# Patient Record
Sex: Male | Born: 1985
Health system: Southern US, Community
[De-identification: ages and names within clinical notes are randomized; demographics above are authoritative.]

## PROBLEM LIST (undated history)

## (undated) DIAGNOSIS — D593 Hemolytic-uremic syndrome, unspecified: Secondary | ICD-10-CM

## (undated) DIAGNOSIS — I82629 Acute embolism and thrombosis of deep veins of unspecified upper extremity: Secondary | ICD-10-CM

## (undated) HISTORY — DX: Acute embolism and thrombosis of deep veins of unspecified upper extremity: I82.629

## (undated) HISTORY — DX: Hemolytic-uremic syndrome, unspecified: D59.30

## (undated) HISTORY — DX: Hemolytic-uremic syndrome: D59.3

---
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
## 2018-09-20 LAB — HM COLONOSCOPY

## 2019-07-16 ENCOUNTER — Ambulatory Visit: Payer: Self-pay | Admitting: Family Medicine

## 2019-07-25 ENCOUNTER — Other Ambulatory Visit: Payer: Self-pay

## 2019-07-25 ENCOUNTER — Encounter: Payer: Self-pay | Admitting: Family Medicine

## 2019-07-25 ENCOUNTER — Ambulatory Visit (INDEPENDENT_AMBULATORY_CARE_PROVIDER_SITE_OTHER): Payer: No Typology Code available for payment source | Admitting: Family Medicine

## 2019-07-25 VITALS — BP 108/74 | HR 71 | Temp 98.7°F | Ht 69.0 in | Wt 169.4 lb

## 2019-07-25 DIAGNOSIS — Z0001 Encounter for general adult medical examination with abnormal findings: Secondary | ICD-10-CM

## 2019-07-25 DIAGNOSIS — Z23 Encounter for immunization: Secondary | ICD-10-CM

## 2019-07-25 DIAGNOSIS — Z6825 Body mass index (BMI) 25.0-25.9, adult: Secondary | ICD-10-CM

## 2019-07-25 DIAGNOSIS — J309 Allergic rhinitis, unspecified: Secondary | ICD-10-CM | POA: Diagnosis not present

## 2019-07-25 DIAGNOSIS — E663 Overweight: Secondary | ICD-10-CM

## 2019-07-25 MED ORDER — FLUTICASONE PROPIONATE 50 MCG/ACT NA SUSP: 2.0000 | Freq: Every day | NASAL | 6 refills | Status: DC

## 2019-07-25 NOTE — Progress Notes (Addendum)
Chief Complaint:  Charles Gordon is a 33 y.o. male who presents today for his annual comprehensive physical exam and to establish care.   Assessment/Plan:  Allergic Rhinitis Stable. Sent in Rx for flonase.   Body mass index is 25.01 kg/m. / Overweight BMI Metric Follow Up - 07/25/19 1430      BMI Metric Follow Up-Please document annually   BMI Metric Follow Up  Education provided        Preventative Healthcare: Flu vaccine today. UTD on other screenings and immunizations.   Patient Counseling(The following topics were reviewed and/or handout was given):  -Nutrition: Stressed importance of moderation in sodium/caffeine intake, saturated fat and cholesterol, caloric balance, sufficient intake of fresh fruits, vegetables, and fiber.  -Stressed the importance of regular exercise.   -Substance Abuse: Discussed cessation/primary prevention of tobacco, alcohol, or other drug use; driving or other dangerous activities under the influence; availability of treatment for abuse.   -Injury prevention: Discussed safety belts, safety helmets, smoke detector, smoking near bedding or upholstery.   -Sexuality: Discussed sexually transmitted diseases, partner selection, use of condoms, avoidance of unintended pregnancy and contraceptive alternatives.   -Dental health: Discussed importance of regular tooth brushing, flossing, and dental visits.  -Health maintenance and immunizations reviewed. Please refer to Health maintenance section.  Return to care in 1 year for next preventative visit.     Subjective:  HPI:  He has no acute complaints today.   He recently moved to [facility] and has had some nasal allergies.  He has used Flonase in the past and is done well with this.  Would like a refill today.  Lifestyle Diet: No specific diets or eating plans. Tries to eat a lot of fruits and vegetables.  Exercise: No specific exercises.   Depression screen PHQ 2/9 07/25/2019  Decreased Interest 0  Down,  Depressed, Hopeless 0  PHQ - 2 Score 0    Health Maintenance Due  Topic Date Due  . HIV Screening  02/28/2000  . TETANUS/TDAP  02/28/2004     ROS: Per HPI, otherwise a complete review of systems was negative.   PMH:  The following were reviewed and entered/updated in epic: Past Medical History:  Diagnosis Date  . DVT of upper extremity (deep vein thrombosis) ([place])   . 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  Past Surgical History:  Procedure Laterality Date  . PILONIDAL CYST EXCISION  2003    Family History  Problem Relation Age of Onset  . Colon cancer Maternal Grandmother        onset in late 85s  . Prostate cancer Neg Hx     Medications- reviewed and updated Current Outpatient Medications  Medication Sig Dispense Refill  . fluticasone (FLONASE) 50 MCG/ACT nasal spray Place 2 sprays into both nostrils daily. 16 g 6   No current facility-administered medications for this visit.     Allergies-reviewed and updated Allergies  Allergen Reactions  . Sulfa Antibiotics Swelling  . Penicillins Rash    Social History   Socioeconomic History  . Marital status: Married    Spouse name: Not on file  . Number of children: Not on file  . Years of education: Not on file  . Highest education level: Not on file  Occupational History  . Not on file  Social Needs  . Financial resource strain: Not on file  . Food insecurity    Worry:  Not on file    Inability: Not on file  . Transportation needs    Medical: Not on file    Non-medical: Not on file  Tobacco Use  . Smoking status: Never Smoker  . Smokeless tobacco: Never Used  Substance and Sexual Activity  . Alcohol use: Never    Frequency: Never  . Drug use: Never  . Sexual activity: Not on file  Lifestyle  . Physical activity    Days per week: Not on file    Minutes per session: Not on file  . Stress: Not on file  Relationships  .  Social Herbalist on phone: Not on file    Gets together: Not on file    Attends religious service: Not on file    Active member of club or organization: Not on file    Attends meetings of clubs or organizations: Not on file    Relationship status: Not on file  Other Topics Concern  . Not on file  Social History Narrative  . Not on file        Objective:  Physical Exam: BP 108/74   Pulse 71   Temp 98.7 F (37.1 C)   Ht 5\' 9"  (1.753 m)   Wt 169 lb 6.1 oz (76.8 kg)   SpO2 99%   BMI 25.01 kg/m   Body mass index is 25.01 kg/m. Wt Readings from Last 3 Encounters:  07/25/19 169 lb 6.1 oz (76.8 kg)   Gen: NAD, resting comfortably HEENT: TMs normal bilaterally. OP clear. No thyromegaly noted.  CV: RRR with no murmurs appreciated Pulm: NWOB, CTAB with no crackles, wheezes, or rhonchi GI: Normal bowel sounds present. Soft, Nontender, Nondistended. MSK: no edema, cyanosis, or clubbing noted Skin: warm, dry Neuro: CN2-12 grossly intact. Strength 5/5 in upper and lower extremities. Reflexes symmetric and intact bilaterally.  Psych: Normal affect and thought content     Saydee Zolman M. Jerline Pain, MD 07/25/2019 2:30 PM

## 2019-07-25 NOTE — Patient Instructions (Addendum)
It was very nice to see you today!  Keep up the good work!  I will send in your Flonase.  Please come back to see me in 1 year for your physical with blood work, or sooner if needed.   Take care, Dr Jerline Pain  Please try these tips to maintain a healthy lifestyle:   Eat at least 3 REAL meals and 1-2 snacks per day.  Aim for no more than 5 hours between eating.  If you eat breakfast, please do so within one hour of getting up.    Obtain twice as many fruits/vegetables as protein or carbohydrate foods for both lunch and dinner. (Half of each meal should be fruits/vegetables, one quarter protein, and one quarter starchy carbs)   Cut down on sweet beverages. This includes juice, soda, and sweet tea.    Exercise at least 150 minutes every week.    Preventive Care 9-1 Years Old, Male Preventive care refers to lifestyle choices and visits with your health care provider that can promote health and wellness. This includes:  A yearly physical exam. This is also called an annual well check.  Regular dental and eye exams.  Immunizations.  Screening for certain conditions.  Healthy lifestyle choices, such as eating a healthy diet, getting regular exercise, not using drugs or products that contain nicotine and tobacco, and limiting alcohol use. What can I expect for my preventive care visit? Physical exam Your health care provider will check:  Height and weight. These may be used to calculate body mass index (BMI), which is a measurement that tells if you are at a healthy weight.  Heart rate and blood pressure.  Your skin for abnormal spots. Counseling Your health care provider may ask you questions about:  Alcohol, tobacco, and drug use.  Emotional well-being.  Home and relationship well-being.  Sexual activity.  Eating habits.  Work and work Statistician. What immunizations do I need?  Influenza (flu) vaccine  This is recommended every year. Tetanus, diphtheria,  and pertussis (Tdap) vaccine  You may need a Td booster every 10 years. Varicella (chickenpox) vaccine  You may need this vaccine if you have not already been vaccinated. Human papillomavirus (HPV) vaccine  If recommended by your health care provider, you may need three doses over 6 months. Measles, mumps, and rubella (MMR) vaccine  You may need at least one dose of MMR. You may also need a second dose. Meningococcal conjugate (MenACWY) vaccine  One dose is recommended if you are 29-85 years old and a Market researcher living in a residence hall, or if you have one of several medical conditions. You may also need additional booster doses. Pneumococcal conjugate (PCV13) vaccine  You may need this if you have certain conditions and were not previously vaccinated. Pneumococcal polysaccharide (PPSV23) vaccine  You may need one or two doses if you smoke cigarettes or if you have certain conditions. Hepatitis A vaccine  You may need this if you have certain conditions or if you travel or work in places where you may be exposed to hepatitis A. Hepatitis B vaccine  You may need this if you have certain conditions or if you travel or work in places where you may be exposed to hepatitis B. Haemophilus influenzae type b (Hib) vaccine  You may need this if you have certain risk factors. You may receive vaccines as individual doses or as more than one vaccine together in one shot (combination vaccines). Talk with your health care provider about the  risks and benefits of combination vaccines. What tests do I need? Blood tests  Lipid and cholesterol levels. These may be checked every 5 years starting at age 36.  Hepatitis C test.  Hepatitis B test. Screening   Diabetes screening. This is done by checking your blood sugar (glucose) after you have not eaten for a while (fasting).  Sexually transmitted disease (STD) testing. Talk with your health care provider about your test  results, treatment options, and if necessary, the need for more tests. Follow these instructions at home: Eating and drinking   Eat a diet that includes fresh fruits and vegetables, whole grains, lean protein, and low-fat dairy products.  Take vitamin and mineral supplements as recommended by your health care provider.  Do not drink alcohol if your health care provider tells you not to drink.  If you drink alcohol: ? Limit how much you have to 0-2 drinks a day. ? Be aware of how much alcohol is in your drink. In the [place] one drink equals one 12 oz bottle of beer (355 mL), one 5 oz glass of wine (148 mL), or one 1 oz glass of hard liquor (44 mL). Lifestyle  Take daily care of your teeth and gums.  Stay active. Exercise for at least 30 minutes on 5 or more days each week.  Do not use any products that contain nicotine or tobacco, such as cigarettes, e-cigarettes, and chewing tobacco. If you need help quitting, ask your health care provider.  If you are sexually active, practice safe sex. Use a condom or other form of protection to prevent STIs (sexually transmitted infections). What's next?  Go to your health care provider once a year for a well check visit.  Ask your health care provider how often you should have your eyes and teeth checked.  Stay up to date on all vaccines. This information is not intended to replace advice given to you by your health care provider. Make sure you discuss any questions you have with your health care provider. Document Released: 11/29/2001 Document Revised: 09/27/2018 Document Reviewed: 09/27/2018 Elsevier Patient Education  2020 [employer].

## 2019-07-25 NOTE — Addendum Note (Signed)
Addended by: Loralyn Freshwater on: 07/25/2019 02:40 PM   Modules accepted: Orders

## 2019-08-27 ENCOUNTER — Encounter: Payer: Self-pay | Admitting: Family Medicine

## 2019-12-29 ENCOUNTER — Encounter: Payer: Self-pay | Admitting: Family Medicine

## 2019-12-30 ENCOUNTER — Telehealth: Payer: Self-pay | Admitting: Family Medicine

## 2019-12-30 ENCOUNTER — Other Ambulatory Visit: Payer: Self-pay

## 2019-12-30 DIAGNOSIS — R1031 Right lower quadrant pain: Secondary | ICD-10-CM

## 2019-12-30 NOTE — Telephone Encounter (Signed)
Patient has an appointment on 3/25.  His condition has worsened.  Patient works at [employer].  He is requesting to get worked in this afternoon with Dr. Jerline Pain due to his work schedule the rest of the week.

## 2019-12-30 NOTE — Telephone Encounter (Signed)
Noted  

## 2019-12-31 ENCOUNTER — Other Ambulatory Visit: Payer: Self-pay | Admitting: Family Medicine

## 2019-12-31 DIAGNOSIS — R1031 Right lower quadrant pain: Secondary | ICD-10-CM

## 2020-01-02 ENCOUNTER — Ambulatory Visit: Payer: No Typology Code available for payment source | Admitting: Family Medicine

## 2020-01-03 ENCOUNTER — Ambulatory Visit (INDEPENDENT_AMBULATORY_CARE_PROVIDER_SITE_OTHER): Payer: No Typology Code available for payment source | Admitting: Family Medicine

## 2020-01-03 ENCOUNTER — Other Ambulatory Visit: Payer: Self-pay

## 2020-01-03 ENCOUNTER — Encounter: Payer: Self-pay | Admitting: Family Medicine

## 2020-01-03 VITALS — BP 110/70 | HR 72 | Temp 98.7°F | Ht 69.0 in | Wt 169.7 lb

## 2020-01-03 DIAGNOSIS — R1031 Right lower quadrant pain: Secondary | ICD-10-CM | POA: Diagnosis not present

## 2020-01-03 LAB — BASIC METABOLIC PANEL
BUN: 13 mg/dL (ref 6–23)
CO2: 30 mEq/L (ref 19–32)
Calcium: 9.4 mg/dL (ref 8.4–10.5)
Chloride: 103 mEq/L (ref 96–112)
Creatinine, Ser: 0.88 mg/dL (ref 0.40–1.50)
GFR: 98.64 mL/min (ref >60.00)
Glucose, Bld: 100 mg/dL — ABNORMAL HIGH (ref 70–99)
Potassium: 3.9 mEq/L (ref 3.5–5.1)
Sodium: 137 mEq/L (ref 135–145)

## 2020-01-03 LAB — CBC
HCT: 42.6 % (ref 39.0–52.0)
Hemoglobin: 14.8 g/dL (ref 13.0–17.0)
MCHC: 34.7 g/dL (ref 30.0–36.0)
MCV: 90 fl (ref 78.0–100.0)
Platelets: 177 10*3/uL (ref 150.0–400.0)
RBC: 4.73 Mil/uL (ref 4.22–5.81)
RDW: 13.4 % (ref 11.5–15.5)
WBC: 7.1 10*3/uL (ref 4.0–10.5)

## 2020-01-03 NOTE — Progress Notes (Signed)
   Charles Gordon is a 34 y.o. male who presents today for an office visit.  Assessment/Plan:  New/Acute Problems: Right groin pain Possible lymphadenopathy versus spermatocele.  Will check ultrasound to further evaluate.  Check CBC and BMP today.  He will continue using over-the-counter meds as needed.    Subjective:  HPI:  Started a few weeks ago on right testicle. Felt tender to palpation. No pain at rest. No dysuria. No obvious trauma or precipitating events. Took ibuprofen which seemed to help.        Objective:  Physical Exam: BP 110/70 (BP Location: Right Arm, Patient Position: Sitting, Cuff Size: Normal)   Pulse 72   Temp 98.7 F (37.1 C) (Temporal)   Ht 5\' 9"  (1.753 m)   Wt 169 lb 11.2 oz (77 kg)   SpO2 98%   BMI 25.06 kg/m   Gen: No acute distress, resting comfortably CV: Regular rate and rhythm with no murmurs appreciated Pulm: Normal work of breathing, clear to auscultation bilaterally with no crackles, wheezes, or rhonchi GU: Small tender nodule on the right spermatic cord.  No inguinal hernias appreciated.  Mild lymphadenopathy in right inguinal area. Neuro: Grossly normal, moves all extremities Psych: Normal affect and thought content      Xylina Rhoads M. Jerline Pain, MD 01/03/2020 1:02 PM

## 2020-01-03 NOTE — Patient Instructions (Signed)
It was very nice to see you today!  We will check blood work today and the ultrasound next week.  Take care, Dr Jerline Pain  Please try these tips to maintain a healthy lifestyle:   Eat at least 3 REAL meals and 1-2 snacks per day.  Aim for no more than 5 hours between eating.  If you eat breakfast, please do so within one hour of getting up.    Each meal should contain half fruits/vegetables, one quarter protein, and one quarter carbs (no bigger than a computer mouse)   Cut down on sweet beverages. This includes juice, soda, and sweet tea.     Drink at least 1 glass of water with each meal and aim for at least 8 glasses per day   Exercise at least 150 minutes every week.

## 2020-01-06 ENCOUNTER — Other Ambulatory Visit: Payer: Self-pay

## 2020-01-06 ENCOUNTER — Telehealth: Payer: Self-pay

## 2020-01-06 ENCOUNTER — Ambulatory Visit (HOSPITAL_COMMUNITY)
Admission: RE | Admit: 2020-01-06 | Discharge: 2020-01-06 | Disposition: A | Discharge status: Home / Self Care | Payer: No Typology Code available for payment source | Source: Ambulatory Visit | Attending: Family Medicine | Admitting: Family Medicine

## 2020-01-06 ENCOUNTER — Encounter (HOSPITAL_COMMUNITY): Payer: Self-pay

## 2020-01-06 DIAGNOSIS — R1031 Right lower quadrant pain: Secondary | ICD-10-CM

## 2020-01-06 DIAGNOSIS — N50811 Right testicular pain: Secondary | ICD-10-CM

## 2020-01-06 NOTE — Telephone Encounter (Signed)
Ultrasound placed

## 2020-01-06 NOTE — Telephone Encounter (Signed)
I Spoke with pt, stated he need a new order for US scrotum

## 2020-01-06 NOTE — Telephone Encounter (Signed)
Im trying to figure this out - his referral was to [facility] and it got scheduled at [facility]??  I am not sure how this happened.  I'll update you once I figure this out.

## 2020-01-06 NOTE — Telephone Encounter (Signed)
Hi stephanie can you help to see what we need to do?  Charles Gordon. Jerline Pain, MD 01/06/2020 1:16 PM

## 2020-01-06 NOTE — Progress Notes (Signed)
Dr Marigene Ehlers interpretation of your lab work:  Labs all STABLE.    If you have any additional questions, please give [place] a call or send [place] a message through [address].  Take care, Dr Jerline Pain

## 2020-01-06 NOTE — Telephone Encounter (Signed)
Patient calling in regarding a referral for ultrasound. Patient went to the appt to have ultrasound done and they told patient they he did not have that correct referral to have this completed. Please follow up with patient.

## 2020-01-09 ENCOUNTER — Ambulatory Visit: Payer: No Typology Code available for payment source | Admitting: Family Medicine

## 2020-01-09 ENCOUNTER — Ambulatory Visit
Admission: RE | Admit: 2020-01-09 | Discharge: 2020-01-09 | Disposition: A | Discharge status: Home / Self Care | Payer: No Typology Code available for payment source | Source: Ambulatory Visit | Attending: Family Medicine | Admitting: Family Medicine

## 2020-01-09 DIAGNOSIS — N50811 Right testicular pain: Secondary | ICD-10-CM

## 2020-01-10 NOTE — Progress Notes (Signed)
Please inform patient of the following:  Good news! No abnormalities on his ultrasound. We can continue to monitor for now or I am happy to refer to urology for a second look if he wishes.  Please place referral if he prefers that.  Charles Gordon. Jerline Pain, MD 01/10/2020 11:10 AM

## 2020-05-14 ENCOUNTER — Ambulatory Visit (INDEPENDENT_AMBULATORY_CARE_PROVIDER_SITE_OTHER): Payer: No Typology Code available for payment source

## 2020-05-14 ENCOUNTER — Other Ambulatory Visit: Payer: Self-pay

## 2020-05-14 DIAGNOSIS — Z23 Encounter for immunization: Secondary | ICD-10-CM

## 2020-05-14 NOTE — Progress Notes (Signed)
Charles Gordon 34 y.o. male presents to office today for TDAP per Dimas Chyle, MD. Administered TDAP 0.5 ml right arm. Patient tolerated well.

## 2020-06-02 ENCOUNTER — Ambulatory Visit (INDEPENDENT_AMBULATORY_CARE_PROVIDER_SITE_OTHER): Payer: No Typology Code available for payment source | Admitting: Family Medicine

## 2020-06-02 ENCOUNTER — Encounter: Payer: Self-pay | Admitting: Family Medicine

## 2020-06-02 VITALS — BP 123/77 | HR 84 | Temp 99.0°F | Ht 69.0 in | Wt 166.4 lb

## 2020-06-02 DIAGNOSIS — F419 Anxiety disorder, unspecified: Secondary | ICD-10-CM | POA: Diagnosis not present

## 2020-06-02 DIAGNOSIS — R253 Fasciculation: Secondary | ICD-10-CM

## 2020-06-02 MED ORDER — SERTRALINE HCL 50 MG PO TABS: 50.0000 mg | ORAL_TABLET | Freq: Every day | ORAL | 3 refills | Status: DC

## 2020-06-02 NOTE — Progress Notes (Signed)
   Charles Gordon is a 34 y.o. male who presents today for an office visit.  Assessment/Plan:  New/Acute Problems: Fasciculations No red flags.  Reassured patient.  He is very concerned about potential ALS or other underlying diagnosis.  Will place referral to neurology but patient request.  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  Will start Zoloft 50 mg daily.  He will check in with me in a couple weeks and we will titrate the dose as needed.  Also gave contact information for a couple of local therapists.      Subjective:  HPI:  Patient here with concerns for fasciculations over the last few weeks.  Initially located cramps in his left leg.  Had some pain associated with this.  He has now noticed intermittent fasciculations located throughout his entire body.  He is occur randomly.  No weakness.  No vision changes.  No other neurologic symptoms.  Patient is concerned about potential ALS.  He is also been under much more stress recently.  Has been dealing with cancer diagnosis in his mother.  Also recently lost his uncle due to brain cancer.  He has been working night shifts for the hospitalist which is also been fairly stressful.  He is expecting a child next month as well.      Objective:  Physical Exam: BP 123/77   Pulse 84   Temp 99 F (37.2 C) (Temporal)   Ht 5\' 9"  (1.753 m)   Wt 166 lb 6.4 oz (75.5 kg)   SpO2 98%   BMI 24.57 kg/m   Gen: No acute distress, resting comfortably CV: Regular rate and rhythm with no murmurs appreciated Pulm: Normal work of breathing, clear to auscultation bilaterally with no crackles, wheezes, or rhonchi Neuro: Cranial nerves III through XII intact.  Finger-nose-finger testing intact.  Reflexes 2+ and symmetric at patella biceps and Achilles.  Strength 5 out of 5 in upper and lower extremities.  No clonus noted.  Babinski negative. Psych: Normal affect  and thought content  Time Spent: 41 minutes of total time was spent on the date of the encounter performing the following actions: chart review prior to seeing the patient, obtaining history, performing a medically necessary exam, counseling on the treatment plan including referral and starting zoloft, placing orders, and documenting in our EHR.         Algis Greenhouse. Jerline Pain, MD 06/02/2020 1:42 PM

## 2020-06-02 NOTE — Assessment & Plan Note (Addendum)
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  Will start Zoloft 50 mg daily.  He will check in with me in a couple weeks and we will titrate the dose as needed.  Also gave contact information for a couple of local therapists.

## 2020-06-02 NOTE — Patient Instructions (Signed)
It was very nice to see you today!  Please start the Zoloft.  Let me know how things are going in a couple weeks and we can change the dose.  I will place a referral for you to see the neurologist.  Take care, Dr Jerline Pain  Please try these tips to maintain a healthy lifestyle:   Eat at least 3 REAL meals and 1-2 snacks per day.  Aim for no more than 5 hours between eating.  If you eat breakfast, please do so within one hour of getting up.    Each meal should contain half fruits/vegetables, one quarter protein, and one quarter carbs (no bigger than a computer mouse)   Cut down on sweet beverages. This includes juice, soda, and sweet tea.     Drink at least 1 glass of water with each meal and aim for at least 8 glasses per day   Exercise at least 150 minutes every week.

## 2020-06-03 ENCOUNTER — Encounter: Payer: Self-pay | Admitting: Neurology

## 2020-06-05 ENCOUNTER — Ambulatory Visit: Payer: No Typology Code available for payment source | Admitting: Family Medicine

## 2020-06-19 ENCOUNTER — Encounter: Payer: Self-pay | Admitting: Family Medicine

## 2020-06-25 ENCOUNTER — Ambulatory Visit (INDEPENDENT_AMBULATORY_CARE_PROVIDER_SITE_OTHER): Payer: No Typology Code available for payment source

## 2020-06-25 DIAGNOSIS — Z23 Encounter for immunization: Secondary | ICD-10-CM | POA: Diagnosis not present

## 2020-06-25 NOTE — Progress Notes (Signed)
I have reviewed the patient's encounter and agree with the documentation.  Algis Greenhouse. Jerline Pain, MD 06/25/2020 4:20 PM

## 2020-06-25 NOTE — Progress Notes (Signed)
Patient came into the office to receive his flu shot. Tolerated injection well.

## 2020-07-03 NOTE — Telephone Encounter (Signed)
Patient agreed with changes, form 50 to 100 Ok to change?

## 2020-07-06 ENCOUNTER — Other Ambulatory Visit: Payer: Self-pay | Admitting: Family Medicine

## 2020-07-06 MED ORDER — SERTRALINE HCL 100 MG PO TABS: 100.0000 mg | ORAL_TABLET | Freq: Every day | ORAL | 3 refills | Status: DC

## 2020-07-06 MED FILL — SERTRALINE HCL 100 MG TABS: 100 | 90 days supply | Qty: 90 | Fill #0

## 2020-07-13 ENCOUNTER — Other Ambulatory Visit: Payer: Self-pay

## 2020-07-13 ENCOUNTER — Ambulatory Visit (INDEPENDENT_AMBULATORY_CARE_PROVIDER_SITE_OTHER): Payer: No Typology Code available for payment source | Admitting: Neurology

## 2020-07-13 ENCOUNTER — Encounter: Payer: Self-pay | Admitting: Neurology

## 2020-07-13 VITALS — BP 128/80 | HR 78 | Ht 69.0 in | Wt 166.0 lb

## 2020-07-13 DIAGNOSIS — R253 Fasciculation: Secondary | ICD-10-CM

## 2020-07-13 NOTE — Patient Instructions (Addendum)
We will order nerve testing of the left arm and leg  ELECTROMYOGRAM AND NERVE CONDUCTION STUDIES (EMG/NCS) INSTRUCTIONS  How to Prepare The neurologist conducting the EMG will need to know if you have certain medical conditions. Tell the neurologist and other EMG lab personnel if you: . Have a pacemaker or any other electrical medical device . Take blood-thinning medications . Have hemophilia, a blood-clotting disorder that causes prolonged bleeding Bathing Take a shower or bath shortly before your exam in order to remove oils from your skin. Don't apply lotions or creams before the exam.  What to Expect You'll likely be asked to change into a hospital gown for the procedure and lie down on an examination table. The following explanations can help you understand what will happen during the exam.  . Electrodes. The neurologist or a technician places surface electrodes at various locations on your skin depending on where you're experiencing symptoms. Or the neurologist may insert needle electrodes at different sites depending on your symptoms.  . Sensations. The electrodes will at times transmit a tiny electrical current that you may feel as a twinge or spasm. The needle electrode may cause discomfort or pain that usually ends shortly after the needle is removed. If you are concerned about discomfort or pain, you may want to talk to the neurologist about taking a short break during the exam.  . Instructions. During the needle EMG, the neurologist will assess whether there is any spontaneous electrical activity when the muscle is at rest - activity that isn't present in healthy muscle tissue - and the degree of activity when you slightly contract the muscle.  He or she will give you instructions on resting and contracting a muscle at appropriate times. Depending on what muscles and nerves the neurologist is examining, he or she may ask you to change positions during the exam.  After your EMG You may  experience some temporary, minor bruising where the needle electrode was inserted into your muscle. This bruising should fade within several days. If it persists, contact your primary care doctor.

## 2020-07-13 NOTE — Progress Notes (Signed)
[facility] Clinic Note - Initial Visit   Date: 07/13/20  Charles Gordon MRN: [id number] DOB: December 16, 1985   Dear Dr. Jerline Pain:  Thank you for your kind referral of Charles Gordon for consultation of fasciculations. Although his history is well known to you, please allow [place] to reiterate it for the purpose of our medical record. The patient was accompanied to the clinic by self.    History of Present Illness: Charles Gordon is a 34 y.o. left-handed male with anxiety presenting for evaluation of fasciculations. 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  Starting in early July 2021, he began having twitching in the left calf, which has progressed to involve his whole body.  Prior to symptom onset, he was at a wedding where he was physically active and recalls having some calf pain following this. He has sporadic muscle twitches in the legs, arms, and face.  He does not have any weakness. It is always worse with stress/anxiety, which has been high lately - He completed OBGYN fellowship at [facility], contract negotiation, recently had a baby, mother diagnosed with cancer, and death in family.   Past Medical History:  Diagnosis Date  . DVT of upper extremity (deep vein thrombosis) ([place])   . 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 PILONIDAL CYST EXCISION  2003     Medications:  Outpatient Encounter Medications as of 07/13/2020  Medication Sig  . fluticasone (FLONASE) 50 MCG/ACT nasal spray Place 2 sprays into both nostrils daily.  . sertraline (ZOLOFT) 100 MG tablet Take 1 tablet (100 mg total) by mouth daily.   No facility-administered encounter medications on file as of 07/13/2020.    Allergies:  Allergies  Allergen Reactions  . Sulfa Antibiotics Swelling  . Penicillins Rash    Family History: Family History  Problem Relation Age of Onset  . Colon cancer Maternal Grandmother         onset in late 42s  . Prostate cancer Neg Hx     Social History: Social History   Tobacco Use  . Smoking status: Never Smoker  . Smokeless tobacco: Never Used  Vaping Use  . Vaping Use: Never used  Substance Use Topics  . Alcohol use: Never  . Drug use: Never   Social History   Social History Narrative   Left Handed and uses right hand as well.    Lives in a one story home   Drinks caffeine     Vital Signs:  BP 128/80   Pulse 78   Ht 5\' 9"  (1.753 m)   Wt 166 lb (75.3 kg)   SpO2 99%   BMI 24.51 kg/m   Neurological Exam: MENTAL STATUS including orientation to time, place, person, recent and remote memory, attention span and concentration, language, and fund of knowledge is normal.  Speech is not dysarthric.  CRANIAL NERVES: II:  No visual field defects. III-IV-VI: Pupils equal round and reactive to light.  Normal conjugate, extra-ocular eye movements in all directions of gaze.  No nystagmus.  No ptosis.   V:  Normal facial sensation.    VII:  Normal facial symmetry and movements.   VIII:  Normal hearing and vestibular function.   IX-X:  Normal palatal movement.   XI:  Normal shoulder shrug and head rotation.   XII:  Normal tongue strength and range of motion, no deviation or fasciculation.  MOTOR:  No atrophy, fasciculations or  abnormal movements.  No pronator drift.   Upper Extremity:  Right  Left  Deltoid  5/5   5/5   Biceps  5/5   5/5   Triceps  5/5   5/5   Infraspinatus 5/5  5/5  Medial pectoralis 5/5  5/5  Wrist extensors  5/5   5/5   Wrist flexors  5/5   5/5   Finger extensors  5/5   5/5   Finger flexors  5/5   5/5   Dorsal interossei  5/5   5/5   Abductor pollicis  5/5   5/5   Tone (Ashworth scale)  0  0   Lower Extremity:  Right  Left  Hip flexors  5/5   5/5   Hip extensors  5/5   5/5   Adductor 5/5  5/5  Abductor 5/5  5/5  Knee flexors  5/5   5/5   Knee extensors  5/5   5/5   Dorsiflexors  5/5   5/5   Plantarflexors  5/5   5/5   Toe  extensors  5/5   5/5   Toe flexors  5/5   5/5   Tone (Ashworth scale)  0  0   MSRs:  Right        Left                  brachioradialis 2+  2+  biceps 2+  2+  triceps 2+  2+  patellar 2+  2+  ankle jerk 2+  2+  Hoffman no  no  plantar response down  down   SENSORY:  Normal and symmetric perception of light touch, pinprick, vibration, and proprioception.  Romberg's sign absent.   COORDINATION/GAIT: Normal finger-to- nose-finger and heel-to-shin.  Intact rapid alternating movements bilaterally.  Able to rise from a chair without using arms.  Gait narrow based and stable. Tandem and stressed gait intact.    IMPRESSION: Benign fasciculations, triggered by stress/anxiety.  Normal neurological exam makes worrisome condition such as ALS very unlikely.  Reassurance provided.  Patient would like to proceed with NCS/EMG to be sure no other conditions are missed.    Thank you for allowing me to participate in patient's care.  If I can answer any additional questions, I would be pleased to do so.    Sincerely,    Charles Proffit K. Posey Pronto, DO

## 2020-07-31 ENCOUNTER — Ambulatory Visit: Payer: No Typology Code available for payment source | Admitting: Neurology

## 2020-08-12 ENCOUNTER — Other Ambulatory Visit: Payer: Self-pay

## 2020-08-12 ENCOUNTER — Ambulatory Visit (INDEPENDENT_AMBULATORY_CARE_PROVIDER_SITE_OTHER): Payer: No Typology Code available for payment source | Admitting: Neurology

## 2020-08-12 DIAGNOSIS — R253 Fasciculation: Secondary | ICD-10-CM | POA: Diagnosis not present

## 2020-08-12 NOTE — Procedures (Signed)
[facility]  [place] [address], [place]  [place], [place] [address] Tel: [phone] Fax:  [phone] Test Date:  08/12/2020  Patient: Charles Gordon DOB: 12/05/1985 Physician: Narda Amber, DO  Sex: Male Height: 5\' 9"  Ref Phys: Narda Amber, DO  ID#: [id number] Temp: 35.0C Technician:    Patient Complaints: This is a 34 year old man referred for evaluation of generalized muscle twitches.  NCV & EMG Findings: Extensive electrodiagnostic testing of the left upper and lower extremity shows: 1. All sensory responses including the left median, ulnar, sural, and superficial peroneal nerves are within normal limits. 2. All motor responses including the left median, ulnar, peroneal, and tibial nerves are within normal limits. 3. Left tibial H reflex study is within normal limits. 4. There is no evidence of active or chronic motor axonal loss changes affecting any of the tested muscles.  Motor unit configuration and recruitment pattern is within normal limits.  Impression: This is a normal study of the left upper and lower extremities.  In particular, there is no evidence of a disorder of anterior horn cells, sensorimotor polyneuropathy, or cervical/lumbosacral radiculopathy.   ___________________________ Narda Amber, DO    Nerve Conduction Studies Anti Sensory Summary Table   Stim Site NR Peak (ms) Norm Peak (ms) P-T Amp (V) Norm P-T Amp  Left Median Anti Sensory (2nd Digit)  35C  Wrist    2.9 <3.4 60.4 >20  Left Sup Peroneal Anti Sensory (Ant Lat Mall)  35C  12 cm    3.2 <4.5 13.7 >5  Left Sural Anti Sensory (Lat Mall)  35C  Calf    3.1 <4.5 22.2 >5  Left Ulnar Anti Sensory (5th Digit)  35C  Wrist    3.0 <3.1 48.3 >12   Motor Summary Table   Stim Site NR Onset (ms) Norm Onset (ms) O-P Amp (mV) Norm O-P Amp Site1 Site2 Delta-0 (ms) Dist (cm) Vel (m/s) Norm Vel (m/s)  Left Median Motor (Abd Poll Brev)  35C  Wrist    3.0 <3.9 12.7 >6 Elbow Wrist 5.0 28.0 56  >50  Elbow    8.0  12.5         Left Peroneal Motor (Ext Dig Brev)  35C  Ankle    4.0 <5.5 5.7 >3 B Fib Ankle 6.8 34.0 50 >40  B Fib    10.8  5.2  Poplt B Fib 1.5 9.0 60 >40  Poplt    12.3  5.2         Left Tibial Motor (Abd Hall Brev)  35C  Ankle    5.1 <6.0 15.8 >8 Knee Ankle 7.9 38.0 48 >40  Knee    13.0  10.0         Left Ulnar Motor (Abd Dig Minimi)  35C  Wrist    2.7 <3.1 10.3 >7 B Elbow Wrist 3.8 22.0 58 >50  B Elbow    6.5  9.9  A Elbow B Elbow 1.7 10.0 59 >50  A Elbow    8.2  9.2          H Reflex Studies   NR H-Lat (ms) Lat Norm (ms) L-R H-Lat (ms)  Left Tibial (Gastroc)  35C     28.98 <35    EMG   Side Muscle Ins Act Fibs Psw Fasc Number Recrt Dur Dur. Amp Amp. Poly Poly. Comment  Left AntTibialis Nml Nml Nml Nml Nml Nml Nml Nml Nml Nml Nml Nml N/A  Left Gastroc Nml Nml Nml Nml Nml  Nml Nml Nml Nml Nml Nml Nml N/A  Left Flex Dig Long Nml Nml Nml Nml Nml Nml Nml Nml Nml Nml Nml Nml N/A  Left RectFemoris Nml Nml Nml Nml Nml Nml Nml Nml Nml Nml Nml Nml N/A  Left GluteusMed Nml Nml Nml Nml Nml Nml Nml Nml Nml Nml Nml Nml N/A  Left 1stDorInt Nml Nml Nml Nml Nml Nml Nml Nml Nml Nml Nml Nml N/A  Left PronatorTeres Nml Nml Nml Nml Nml Nml Nml Nml Nml Nml Nml Nml N/A  Left Biceps Nml Nml Nml Nml Nml Nml Nml Nml Nml Nml Nml Nml N/A  Left Triceps Nml Nml Nml Nml Nml Nml Nml Nml Nml Nml Nml Nml N/A  Left Deltoid Nml Nml Nml Nml Nml Nml Nml Nml Nml Nml Nml Nml N/A      Waveforms:

## 2020-09-29 MED FILL — SERTRALINE HCL 100 MG TABS: 100 | 90 days supply | Qty: 90 | Fill #1

## 2021-03-05 ENCOUNTER — Encounter: Payer: Self-pay | Admitting: Family Medicine

## 2021-03-05 ENCOUNTER — Other Ambulatory Visit: Payer: Self-pay

## 2021-03-05 ENCOUNTER — Other Ambulatory Visit (HOSPITAL_COMMUNITY): Payer: Self-pay

## 2021-03-05 ENCOUNTER — Ambulatory Visit (INDEPENDENT_AMBULATORY_CARE_PROVIDER_SITE_OTHER): Payer: No Typology Code available for payment source | Admitting: Family Medicine

## 2021-03-05 VITALS — BP 105/66 | HR 71 | Temp 98.7°F | Ht 69.0 in | Wt 189.0 lb

## 2021-03-05 DIAGNOSIS — Z0001 Encounter for general adult medical examination with abnormal findings: Secondary | ICD-10-CM | POA: Diagnosis not present

## 2021-03-05 DIAGNOSIS — D229 Melanocytic nevi, unspecified: Secondary | ICD-10-CM

## 2021-03-05 DIAGNOSIS — Z3009 Encounter for other general counseling and advice on contraception: Secondary | ICD-10-CM | POA: Diagnosis not present

## 2021-03-05 DIAGNOSIS — B360 Pityriasis versicolor: Secondary | ICD-10-CM | POA: Diagnosis not present

## 2021-03-05 DIAGNOSIS — F419 Anxiety disorder, unspecified: Secondary | ICD-10-CM

## 2021-03-05 DIAGNOSIS — E663 Overweight: Secondary | ICD-10-CM

## 2021-03-05 DIAGNOSIS — Z6827 Body mass index (BMI) 27.0-27.9, adult: Secondary | ICD-10-CM

## 2021-03-05 MED ORDER — KETOCONAZOLE 2 % EX SHAM
1.0000 "application " | MEDICATED_SHAMPOO | CUTANEOUS | 5 refills | Status: DC
  Filled 2021-03-05: qty 120, 30d supply, fill #0

## 2021-03-05 MED ORDER — FLUCONAZOLE 150 MG PO TABS
150.0000 mg | ORAL_TABLET | Freq: Once | ORAL | 0 refills | Status: AC
  Filled 2021-03-05: qty 1, 1d supply, fill #0

## 2021-03-05 NOTE — Patient Instructions (Signed)
It was very nice to see you today!  Will send a dose of Diflucan.  I will also send in ketoconazole for your tinea versicolor.  I will refer you to see urology.  Please come back when is convenient for you to have the mole removed.  I will see back in year for your next physical.  Come back to see me sooner if needed.  Take care, Dr Jerline Pain  PLEASE NOTE:  If you had any lab tests please let us know if you have not heard back within a few days. You may see your results on mychart before we have a chance to review them but we will give you a call once they are reviewed by [place]. If we ordered any referrals today, please let us know if you have not heard from their office within the next week.   Please try these tips to maintain a healthy lifestyle:   Eat at least 3 REAL meals and 1-2 snacks per day.  Aim for no more than 5 hours between eating.  If you eat breakfast, please do so within one hour of getting up.    Each meal should contain half fruits/vegetables, one quarter protein, and one quarter carbs (no bigger than a computer mouse)   Cut down on sweet beverages. This includes juice, soda, and sweet tea.     Drink at least 1 glass of water with each meal and aim for at least 8 glasses per day   Exercise at least 150 minutes every week.    Preventive Care 37-19 Years Old, Male Preventive care refers to lifestyle choices and visits with your health care provider that can promote health and wellness. This includes:  A yearly physical exam. This is also called an annual wellness visit.  Regular dental and eye exams.  Immunizations.  Screening for certain conditions.  Healthy lifestyle choices, such as: ? Eating a healthy diet. ? Getting regular exercise. ? Not using drugs or products that contain nicotine and tobacco. ? Limiting alcohol use. What can I expect for my preventive care visit? Physical exam Your health care provider may check your:  Height and weight.  These may be used to calculate your BMI (body mass index). BMI is a measurement that tells if you are at a healthy weight.  Heart rate and blood pressure.  Body temperature.  Skin for abnormal spots. Counseling Your health care provider may ask you questions about your:  Past medical problems.  Family's medical history.  Alcohol, tobacco, and drug use.  Emotional well-being.  Home life and relationship well-being.  Sexual activity.  Diet, exercise, and sleep habits.  Work and work Statistician.  Access to firearms. What immunizations do I need? Vaccines are usually given at various ages, according to a schedule. Your health care provider will recommend vaccines for you based on your age, medical history, and lifestyle or other factors, such as travel or where you work.   What tests do I need? Blood tests  Lipid and cholesterol levels. These may be checked every 5 years starting at age 107.  Hepatitis C test.  Hepatitis B test. Screening  Diabetes screening. This is done by checking your blood sugar (glucose) after you have not eaten for a while (fasting).  Genital exam to check for testicular cancer or hernias.  STD (sexually transmitted disease) testing, if you are at risk. Talk with your health care provider about your test results, treatment options, and if necessary, the need for more  tests.   Follow these instructions at home: Eating and drinking  Eat a healthy diet that includes fresh fruits and vegetables, whole grains, lean protein, and low-fat dairy products.  Drink enough fluid to keep your urine pale yellow.  Take vitamin and mineral supplements as recommended by your health care provider.  Do not drink alcohol if your health care provider tells you not to drink.  If you drink alcohol: ? Limit how much you have to 0-2 drinks a day. ? Be aware of how much alcohol is in your drink. In the [place] one drink equals one 12 oz bottle of beer (355 mL), one 5  oz glass of wine (148 mL), or one 1 oz glass of hard liquor (44 mL).   Lifestyle  Take daily care of your teeth and gums. Brush your teeth every morning and night with fluoride toothpaste. Floss one time each day.  Stay active. Exercise for at least 30 minutes 5 or more days each week.  Do not use any products that contain nicotine or tobacco, such as cigarettes, e-cigarettes, and chewing tobacco. If you need help quitting, ask your health care provider.  Do not use drugs.  If you are sexually active, practice safe sex. Use a condom or other form of protection to prevent STIs (sexually transmitted infections).  Find healthy ways to cope with stress, such as: ? Meditation, yoga, or listening to music. ? Journaling. ? Talking to a trusted person. ? Spending time with friends and family. Safety  Always wear your seat belt while driving or riding in a vehicle.  Do not drive: ? If you have been drinking alcohol. Do not ride with someone who has been drinking. ? When you are tired or distracted. ? While texting.  Wear a helmet and other protective equipment during sports activities.  If you have firearms in your house, make sure you follow all gun safety procedures.  Seek help if you have been physically or sexually abused. What's next?  Go to your health care provider once a year for an annual wellness visit.  Ask your health care provider how often you should have your eyes and teeth checked.  Stay up to date on all vaccines. This information is not intended to replace advice given to you by your health care provider. Make sure you discuss any questions you have with your health care provider. Document Revised: 06/19/2019 Document Reviewed: 09/27/2018 Elsevier Patient Education  2021 [employer].

## 2021-03-05 NOTE — Assessment & Plan Note (Signed)
Doing well on Zoloft 100 mg daily.  We will continue current dose.

## 2021-03-05 NOTE — Progress Notes (Signed)
Chief Complaint:  Charles Gordon is a 35 y.o. male who presents today for his annual comprehensive physical exam.    Assessment/Plan:  New/Acute Problems: Tinea versicolor Will give single dose of Diflucan.  Start topical ketoconazole  Atypical mole He will come back for excisional biopsy.  Desire for vasectomy Referral to urology placed.  Chronic Problems Addressed Today: Anxiety Doing well on Zoloft 100 mg daily.  We will continue current dose.  Preventative Healthcare: Check labs.  Patient Counseling(The following topics were reviewed and/or handout was given):  -Nutrition: Stressed importance of moderation in sodium/caffeine intake, saturated fat and cholesterol, caloric balance, sufficient intake of fresh fruits, vegetables, and fiber.  -Stressed the importance of regular exercise.   -Substance Abuse: Discussed cessation/primary prevention of tobacco, alcohol, or other drug use; driving or other dangerous activities under the influence; availability of treatment for abuse.   -Injury prevention: Discussed safety belts, safety helmets, smoke detector, smoking near bedding or upholstery.   -Sexuality: Discussed sexually transmitted diseases, partner selection, use of condoms, avoidance of unintended pregnancy and contraceptive alternatives.   -Dental health: Discussed importance of regular tooth brushing, flossing, and dental visits.  -Health maintenance and immunizations reviewed. Please refer to Health maintenance section.  Return to care in 1 year for next preventative visit.     Subjective:  HPI:  He has no acute complaints today.   Lifestyle Diet: Balanced.  Exercise: Limited.   Depression screen PHQ 2/9 03/05/2021  Decreased Interest 0  Down, Depressed, Hopeless 0  PHQ - 2 Score 0    Health Maintenance Due  Topic Date Due  . HIV Screening  Never done  . Hepatitis C Screening  Never done     ROS: Per HPI, otherwise a complete review of systems was  negative.   PMH:  The following were reviewed and entered/updated in epic: Past Medical History:  Diagnosis Date  . DVT of upper extremity (deep vein thrombosis) ([place])   . 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 Anxiety 06/02/2020   Past Surgical History:  Procedure Laterality Date  . PILONIDAL CYST EXCISION  2003    Family History  Problem Relation Age of Onset  . Colon cancer Maternal Grandmother        onset in late 80s  . Prostate cancer Neg Hx     Medications- reviewed and updated Current Outpatient Medications  Medication Sig Dispense Refill  . fluconazole (DIFLUCAN) 150 MG tablet Take 1 tablet (150 mg total) by mouth once for 1 dose. 1 tablet 0  . fluticasone (FLONASE) 50 MCG/ACT nasal spray Place 2 sprays into both nostrils daily. 16 g 6  . [START ON 03/08/2021] ketoconazole (NIZORAL) 2 % shampoo Apply 1 application topically 2 (two) times a week. 120 mL 5  . sertraline (ZOLOFT) 100 MG tablet TAKE 1 TABLET BY MOUTH ONCE DAILY 90 tablet 3   No current facility-administered medications for this visit.    Allergies-reviewed and updated Allergies  Allergen Reactions  . Sulfa Antibiotics Swelling  . Penicillins Rash    Social History   Socioeconomic History  . Marital status: Married    Spouse name: Not on file  . Number of children: 3  . Years of education: Not on file  . Highest education level: Not on file  Occupational History  . Not on file  Tobacco Use  . Smoking status: Never Smoker  . Smokeless  tobacco: Never Used  Vaping Use  . Vaping Use: Never used  Substance and Sexual Activity  . Alcohol use: Never  . Drug use: Never  . Sexual activity: Not on file  Other Topics Concern  . Not on file  Social History Narrative   Left Handed and uses right hand as well.    Lives in a one story home   Drinks caffeine    Social Determinants of Health   Financial Resource Strain:  Not on file  Food Insecurity: Not on file  Transportation Needs: Not on file  Physical Activity: Not on file  Stress: Not on file  Social Connections: Not on file        Objective:  Physical Exam: BP 105/66   Pulse 71   Temp 98.7 F (37.1 C) (Temporal)   Ht 5\' 9"  (1.753 m)   Wt 189 lb (85.7 kg)   SpO2 99%   BMI 27.91 kg/m   Body mass index is 27.91 kg/m. Wt Readings from Last 3 Encounters:  03/05/21 189 lb (85.7 kg)  07/13/20 166 lb (75.3 kg)  06/02/20 166 lb 6.4 oz (75.5 kg)   Gen: NAD, resting comfortably HEENT: TMs normal bilaterally. OP clear. No thyromegaly noted.  CV: RRR with no murmurs appreciated Pulm: NWOB, CTAB with no crackles, wheezes, or rhonchi GI: Normal bowel sounds present. Soft, Nontender, Nondistended. MSK: no edema, cyanosis, or clubbing noted Skin: warm, dry.  Approximately 1 cm atypical mole on left inner thigh.  Tinea versicolor also noted. Neuro: CN2-12 grossly intact. Strength 5/5 in upper and lower extremities. Reflexes symmetric and intact bilaterally.  Psych: Normal affect and thought content     Haunani Dickard M. Jerline Pain, MD 03/05/2021 1:47 PM

## 2021-03-09 ENCOUNTER — Other Ambulatory Visit (HOSPITAL_COMMUNITY): Payer: Self-pay

## 2021-04-01 ENCOUNTER — Encounter: Payer: Self-pay | Admitting: Family Medicine

## 2021-04-01 ENCOUNTER — Other Ambulatory Visit (HOSPITAL_COMMUNITY): Payer: Self-pay

## 2021-04-01 MED FILL — Sertraline HCl Tab 100 MG: ORAL | 90 days supply | Qty: 90 | Fill #0 | Status: AC

## 2021-04-01 NOTE — Telephone Encounter (Signed)
Please see message and advise 

## 2021-04-02 ENCOUNTER — Other Ambulatory Visit (HOSPITAL_COMMUNITY): Payer: Self-pay

## 2021-04-27 ENCOUNTER — Encounter: Payer: Self-pay | Admitting: Family Medicine

## 2021-04-27 ENCOUNTER — Other Ambulatory Visit: Payer: Self-pay

## 2021-04-27 ENCOUNTER — Ambulatory Visit (INDEPENDENT_AMBULATORY_CARE_PROVIDER_SITE_OTHER): Payer: No Typology Code available for payment source | Admitting: Family Medicine

## 2021-04-27 VITALS — BP 104/69 | HR 68 | Temp 98.6°F | Ht 69.0 in | Wt 191.0 lb

## 2021-04-27 DIAGNOSIS — D2272 Melanocytic nevi of left lower limb, including hip: Secondary | ICD-10-CM | POA: Diagnosis not present

## 2021-04-27 DIAGNOSIS — L819 Disorder of pigmentation, unspecified: Secondary | ICD-10-CM

## 2021-04-27 DIAGNOSIS — D229 Melanocytic nevi, unspecified: Secondary | ICD-10-CM

## 2021-04-27 HISTORY — DX: Melanocytic nevi, unspecified: D22.9

## 2021-04-27 NOTE — Progress Notes (Signed)
Excisional Biopsy Procedure Note  Pre-operative Diagnosis: Suspicious lesion  Post-operative Diagnosis: same  Locations: Left posteriomedial leg  Indications: Diagnostic  Anesthesia: Lidocaine 1% without epinephrine without added sodium bicarbonate  Procedure Details  The risks, benefits, indications, potential complications, and alternatives were explained to the patient and informed consent obtained.  The lesion and surrounding area was given a sterile prep using betadyne and draped in the usual sterile fashion. A scalpel was used to excise an elliptical area of skin approximately 1cm by 3cm. The wound was closed with 5-0 ethilon using simple interrupted stitches. Antibiotic ointment and a sterile dressing applied. The specimen was sent for pathologic examination. The patient tolerated the procedure well.  EBL: 26ml  Findings: N/A  Condition: Stable  Complications:  None  Plan: 1. Instructed to keep the wound dry and covered for 24-48 hours and clean thereafter. 2. Warning signs of infection were reviewed.   3. Recommended that the patient use OTC analgesics as needed for pain.  4. He will remove sutures at home.

## 2021-05-05 ENCOUNTER — Encounter: Payer: Self-pay | Admitting: Family Medicine

## 2021-05-05 DIAGNOSIS — D229 Melanocytic nevi, unspecified: Secondary | ICD-10-CM

## 2021-05-06 NOTE — Telephone Encounter (Signed)
Okay to send referral to Dermatology?

## 2021-05-10 NOTE — Progress Notes (Signed)
Addressed via [employer]. Patient aware of results.  Algis Greenhouse. Jerline Pain, MD 05/10/2021 12:30 PM

## 2021-07-08 ENCOUNTER — Other Ambulatory Visit: Payer: Self-pay | Admitting: Family Medicine

## 2021-07-08 ENCOUNTER — Other Ambulatory Visit (HOSPITAL_COMMUNITY): Payer: Self-pay

## 2021-07-08 MED FILL — Sertraline HCl Tab 100 MG: ORAL | 90 days supply | Qty: 90 | Fill #0 | Status: AC

## 2021-07-09 ENCOUNTER — Other Ambulatory Visit (HOSPITAL_COMMUNITY): Payer: Self-pay

## 2021-07-30 IMAGING — US US SCROTUM W/ DOPPLER COMPLETE
1 series · 14 of 25 positions shown · non-contrast
Comparison: None.

CLINICAL DATA: Testicular pain, palpable area on right

EXAM:
SCROTAL ULTRASOUND
DOPPLER ULTRASOUND OF THE TESTICLES
TECHNIQUE: Complete ultrasound examination of the testicles, epididymis, and
other scrotal structures was performed. Color and spectral Doppler
ultrasound were also utilized to evaluate blood flow to the
testicles.

[Series 1: us scrotum w/ doppler complete · 0.07mm/px · 14 of 58 slices shown]
[im 1/58]
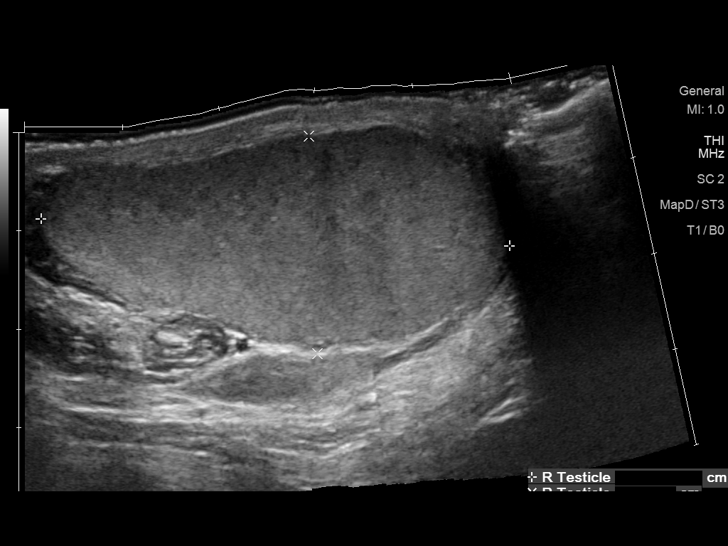
[im 5/58]
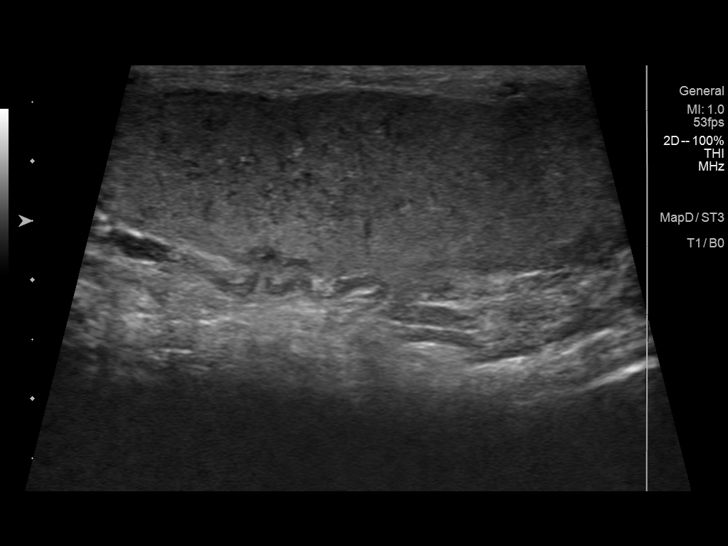
[im 10/58]
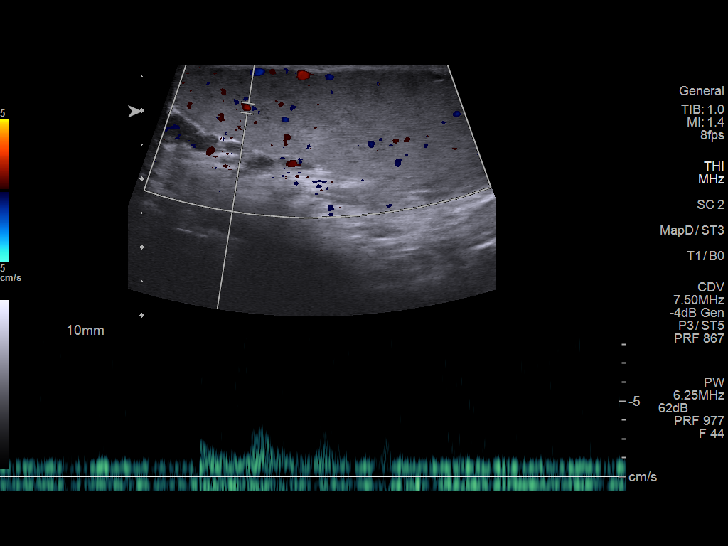
[im 15/58]
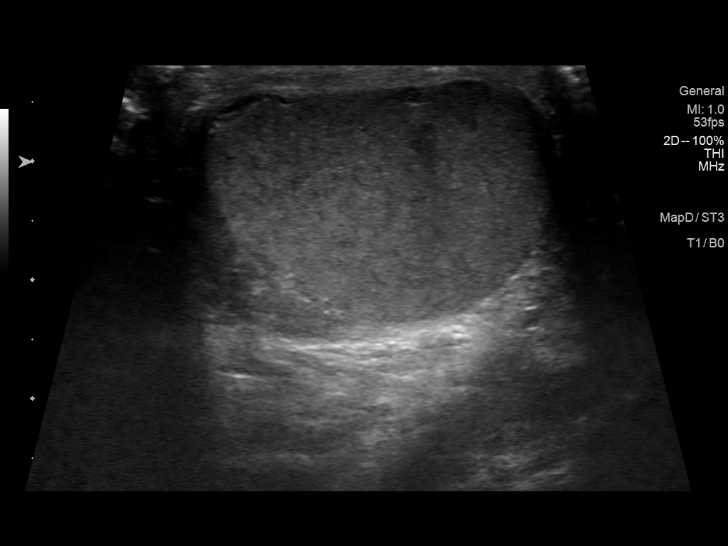
[im 20/58]
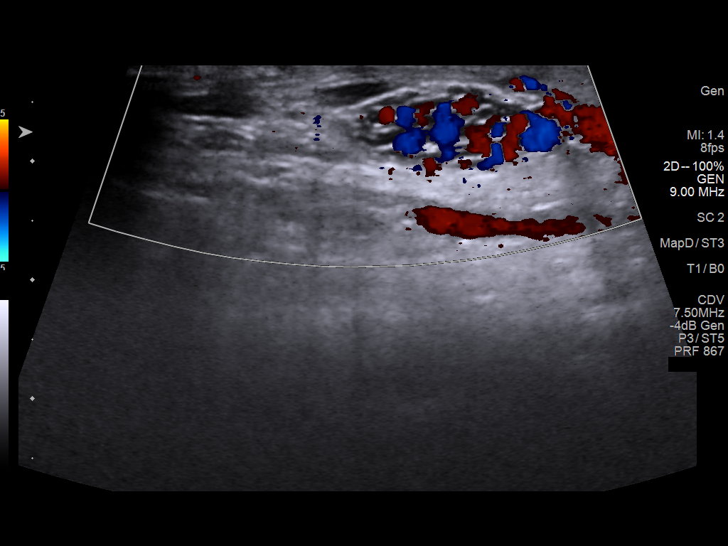
[im 22/58]
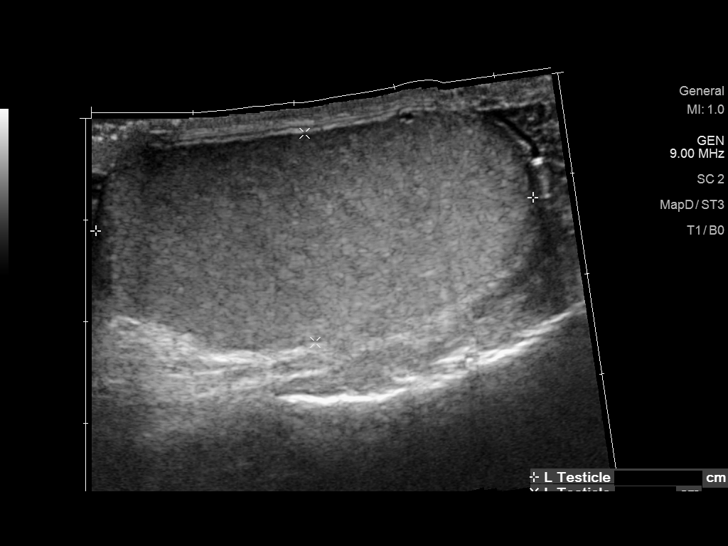
[im 27/58]
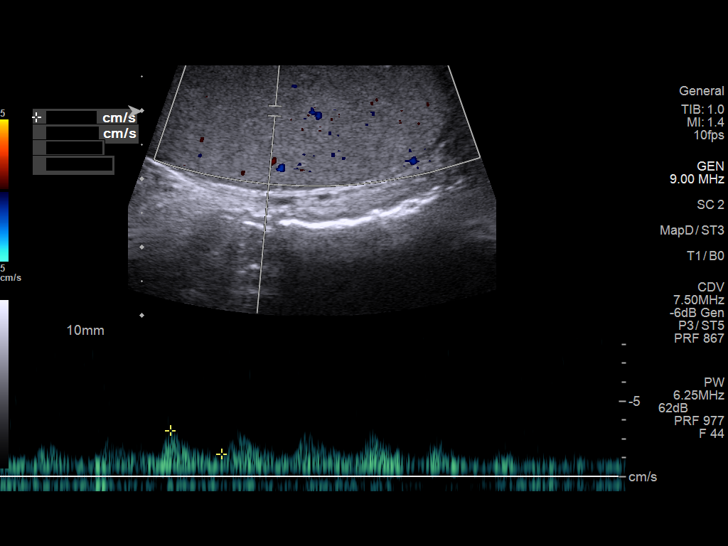
[im 31/58]
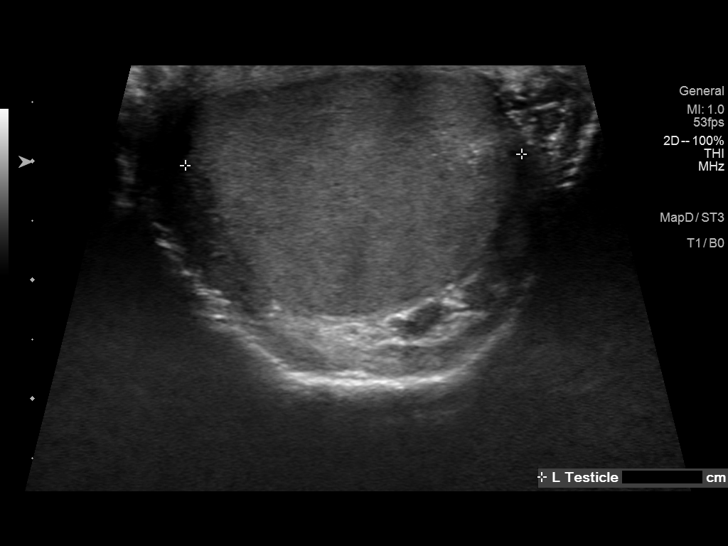
[im 36/58]
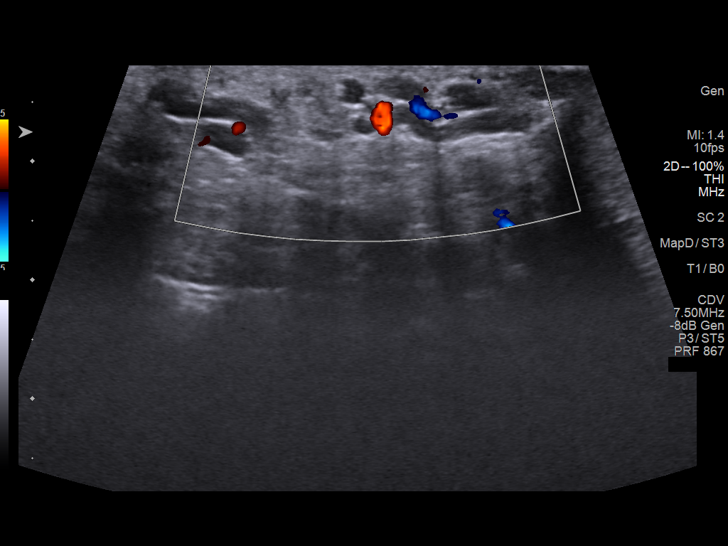
[im 39/58]
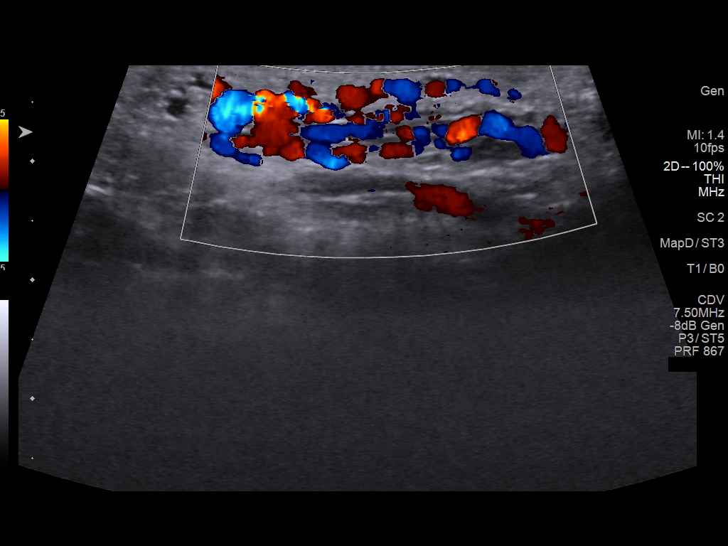
[im 43/58]
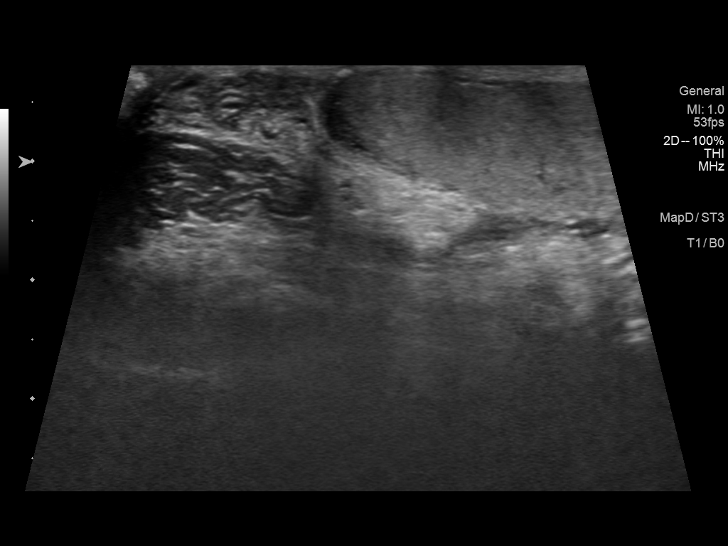
[im 48/58]
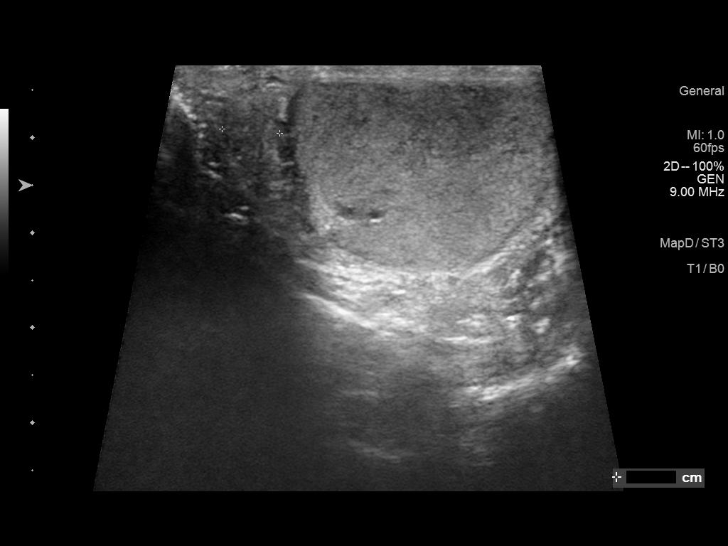
[im 53/58]
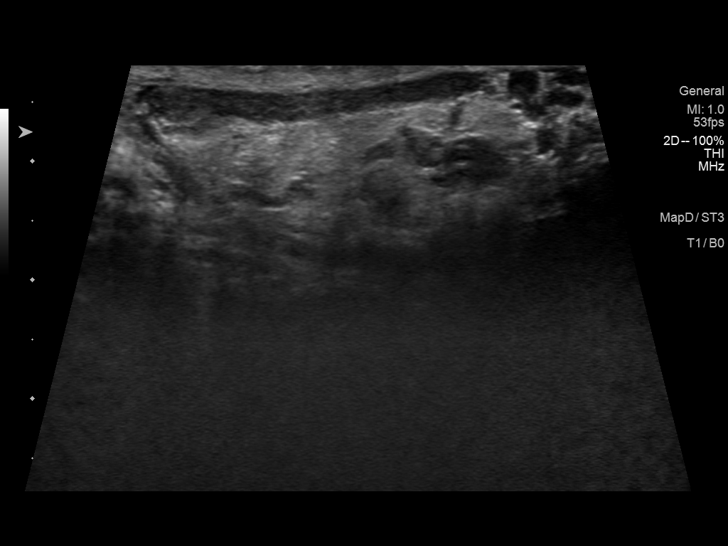
[im 58/58]
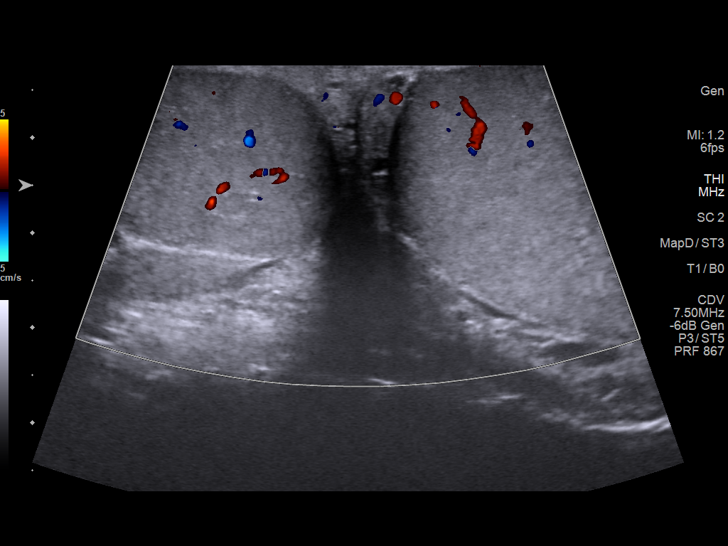

[14 of 25 positions shown; findings below may reference images not displayed]

FINDINGS: Right testicle

Measurements: 4.8 x 2.2 x 3.0 cm. No mass or microlithiasis
visualized.

Left testicle

Measurements: 4.3 x 2.1 x 2.8 cm. No mass or microlithiasis
visualized.

Right epididymis:  Normal in size and appearance.

Left epididymis:  Normal in size and appearance.

Hydrocele:  None visualized.

Varicocele:  None visualized.

Pulsed Doppler interrogation of both testes demonstrates normal low
resistance arterial and venous waveforms bilaterally.
IMPRESSION: No testicular abnormality or evidence of torsion.

No abnormality seen in the area of palpable concern.

## 2021-10-01 ENCOUNTER — Other Ambulatory Visit: Payer: Self-pay | Admitting: Family Medicine

## 2021-10-01 ENCOUNTER — Other Ambulatory Visit (HOSPITAL_COMMUNITY): Payer: Self-pay

## 2021-10-01 DIAGNOSIS — J111 Influenza due to unidentified influenza virus with other respiratory manifestations: Secondary | ICD-10-CM

## 2021-10-01 MED ORDER — OSELTAMIVIR PHOSPHATE 75 MG PO CAPS
75.0000 mg | ORAL_CAPSULE | Freq: Two times a day (BID) | ORAL | 0 refills | Status: AC
  Filled 2021-10-01: qty 10, 5d supply, fill #0

## 2021-10-12 ENCOUNTER — Other Ambulatory Visit (HOSPITAL_COMMUNITY): Payer: Self-pay

## 2021-10-12 MED FILL — Sertraline HCl Tab 100 MG: ORAL | 90 days supply | Qty: 90 | Fill #1 | Status: AC

## 2021-11-23 ENCOUNTER — Other Ambulatory Visit: Payer: Self-pay

## 2021-11-23 ENCOUNTER — Ambulatory Visit (INDEPENDENT_AMBULATORY_CARE_PROVIDER_SITE_OTHER): Payer: No Typology Code available for payment source | Admitting: Dermatology

## 2021-11-23 ENCOUNTER — Encounter: Payer: Self-pay | Admitting: Dermatology

## 2021-11-23 ENCOUNTER — Ambulatory Visit: Payer: No Typology Code available for payment source | Admitting: Dermatology

## 2021-11-23 DIAGNOSIS — L813 Cafe au lait spots: Secondary | ICD-10-CM | POA: Diagnosis not present

## 2021-11-23 DIAGNOSIS — D1801 Hemangioma of skin and subcutaneous tissue: Secondary | ICD-10-CM | POA: Diagnosis not present

## 2021-11-23 DIAGNOSIS — Z1283 Encounter for screening for malignant neoplasm of skin: Secondary | ICD-10-CM | POA: Diagnosis not present

## 2021-11-23 DIAGNOSIS — L918 Other hypertrophic disorders of the skin: Secondary | ICD-10-CM | POA: Diagnosis not present

## 2021-11-23 DIAGNOSIS — L814 Other melanin hyperpigmentation: Secondary | ICD-10-CM

## 2021-11-23 DIAGNOSIS — L821 Other seborrheic keratosis: Secondary | ICD-10-CM

## 2021-11-27 ENCOUNTER — Other Ambulatory Visit (HOSPITAL_COMMUNITY): Payer: Self-pay

## 2021-11-27 MED ORDER — CYCLOBENZAPRINE HCL 5 MG PO TABS
5.0000 mg | ORAL_TABLET | ORAL | 2 refills | Status: DC
  Filled 2021-11-27: qty 30, 10d supply, fill #0

## 2021-12-12 ENCOUNTER — Encounter: Payer: Self-pay | Admitting: Dermatology

## 2021-12-12 NOTE — Progress Notes (Signed)
° °  New Patient   Subjective  Charles Gordon is a 36 y.o. male who presents for the following: Annual Exam (Patient states that Dr. Dimas Chyle  took off lesion on left leg and came back atypical nevi. Patient here for a full body skin exam.).  History of dysplastic mole left leg, general skin examination Location:  Duration:  Quality:  Associated Signs/Symptoms: Modifying Factors:  Severity:  Timing: Context:    The following portions of the chart were reviewed this encounter and updated as appropriate:  Tobacco   Allergies   Meds   Problems   Med Hx   Surg Hx   Fam Hx       Objective  Well appearing patient in no apparent distress; mood and affect are within normal limits. Scalp Total body skin check: Every pigmented spot checked with dermoscopy.  No atypical pigmented spots, no sign of nonmelanoma skin cancer.  I explained the diagnosis of solitary dysplastic mole in some detail; the lesion removed from his left leg showed moderate atypia with clean margins and should not represent by itself with significant risk.  Left Abdomen (side) - Upper, Mid Back 1 mm smooth red dermal papule  Left Abdomen (side) - Lower, Neck - Posterior 2 mm flesh-colored pedunculated papules  Right Abdomen (side) - Upper 2.5 cm tan macule  lower to mid abdomen Textured 5 mm flattopped brown papule  Right Lower Leg - Posterior 4 mm monochrome.  Macule, no dermoscopic atypia    A full examination was performed including scalp, head, eyes, ears, nose, lips, neck, chest, axillae, abdomen, back, buttocks, bilateral upper extremities, bilateral lower extremities, hands, feet, fingers, toes, fingernails, and toenails. All findings within normal limits unless otherwise noted below.   Assessment & Plan  Encounter for screening for malignant neoplasm of skin Scalp  Annual skin examination, self examine twice annually.  Continue ultraviolet protection.  Cherry angioma (2) Left Abdomen (side) -  Upper; Mid Back  No intervention necessary  Skin tag (2) Neck - Posterior; Left Abdomen (side) - Lower  No intervention necessary  Cafe au lait spots Right Abdomen (side) - Upper  No intervention necessary  Seborrheic keratosis lower to mid abdomen  Leave if  stable  Lentigo Right Lower Leg - Posterior  Check.  Change

## 2022-01-07 ENCOUNTER — Other Ambulatory Visit (HOSPITAL_COMMUNITY): Payer: Self-pay

## 2022-01-07 MED FILL — Sertraline HCl Tab 100 MG: ORAL | 90 days supply | Qty: 90 | Fill #2 | Status: AC

## 2022-02-10 ENCOUNTER — Encounter: Payer: Self-pay | Admitting: Family Medicine

## 2022-02-11 ENCOUNTER — Other Ambulatory Visit: Payer: Self-pay

## 2022-02-11 ENCOUNTER — Telehealth: Payer: Self-pay | Admitting: Genetic Counselor

## 2022-02-11 DIAGNOSIS — Z803 Family history of malignant neoplasm of breast: Secondary | ICD-10-CM

## 2022-02-11 NOTE — Telephone Encounter (Signed)
Scheduled appt per 4/28 referral. Pt is aware of appt date and time. Pt is aware to arrive 15 mins prior to appt time and to bring and updated insurance card. Pt is aware of appt location.   ?

## 2022-02-11 NOTE — Telephone Encounter (Signed)
Ok to place referral. ? ?Algis Greenhouse. Jerline Pain, MD ?02/11/2022 9:39 AM  ? ?

## 2022-03-11 ENCOUNTER — Ambulatory Visit (INDEPENDENT_AMBULATORY_CARE_PROVIDER_SITE_OTHER): Payer: No Typology Code available for payment source | Admitting: Family Medicine

## 2022-03-11 ENCOUNTER — Other Ambulatory Visit (HOSPITAL_COMMUNITY): Payer: Self-pay

## 2022-03-11 VITALS — BP 117/86 | HR 66 | Temp 97.7°F | Ht 69.0 in | Wt 230.4 lb

## 2022-03-11 DIAGNOSIS — B360 Pityriasis versicolor: Secondary | ICD-10-CM | POA: Diagnosis not present

## 2022-03-11 DIAGNOSIS — K219 Gastro-esophageal reflux disease without esophagitis: Secondary | ICD-10-CM | POA: Diagnosis not present

## 2022-03-11 DIAGNOSIS — F419 Anxiety disorder, unspecified: Secondary | ICD-10-CM | POA: Diagnosis not present

## 2022-03-11 MED ORDER — PANTOPRAZOLE SODIUM 40 MG PO TBEC
40.0000 mg | DELAYED_RELEASE_TABLET | Freq: Every day | ORAL | 3 refills | Status: DC
  Filled 2022-03-11: qty 90, 90d supply, fill #0
  Filled 2022-08-23 – 2022-10-05 (×2): qty 90, 90d supply, fill #1
  Filled 2022-12-30: qty 90, 90d supply, fill #2

## 2022-03-11 MED ORDER — FLUCONAZOLE 150 MG PO TABS
150.0000 mg | ORAL_TABLET | ORAL | 0 refills | Status: DC | PRN
  Filled 2022-03-11: qty 2, 6d supply, fill #0

## 2022-03-11 NOTE — Patient Instructions (Signed)
It was very nice to see you today!  Please try the Protonix and let me know if not improving.  I will send in diclofenac.  We will see back in year for your next physical.  Come back sooner if needed.  Take care, Dr Jerline Pain  PLEASE NOTE:  If you had any lab tests please let us know if you have not heard back within a few days. You may see your results on mychart before we have a chance to review them but we will give you a call once they are reviewed by [place]. If we ordered any referrals today, please let us know if you have not heard from their office within the next week.   Please try these tips to maintain a healthy lifestyle:  Eat at least 3 REAL meals and 1-2 snacks per day.  Aim for no more than 5 hours between eating.  If you eat breakfast, please do so within one hour of getting up.   Each meal should contain half fruits/vegetables, one quarter protein, and one quarter carbs (no bigger than a computer mouse)  Cut down on sweet beverages. This includes juice, soda, and sweet tea.   Drink at least 1 glass of water with each meal and aim for at least 8 glasses per day  Exercise at least 150 minutes every week.    Preventive Care 49-60 Years Old, Male Preventive care refers to lifestyle choices and visits with your health care provider that can promote health and wellness. Preventive care visits are also called wellness exams. What can I expect for my preventive care visit? Counseling During your preventive care visit, your health care provider may ask about your: Medical history, including: Past medical problems. Family medical history. Current health, including: Emotional well-being. Home life and relationship well-being. Sexual activity. Lifestyle, including: Alcohol, nicotine or tobacco, and drug use. Access to firearms. Diet, exercise, and sleep habits. Safety issues such as seatbelt and bike helmet use. Sunscreen use. Work and work Statistician. Physical exam Your  health care provider may check your: Height and weight. These may be used to calculate your BMI (body mass index). BMI is a measurement that tells if you are at a healthy weight. Waist circumference. This measures the distance around your waistline. This measurement also tells if you are at a healthy weight and may help predict your risk of certain diseases, such as type 2 diabetes and high blood pressure. Heart rate and blood pressure. Body temperature.  Vaccines are usually given at various ages, according to a schedule. Your health care provider will recommend vaccines for you based on your age, medical history, and lifestyle or other factors, such as travel or where you work. What tests do I need? Screening Your health care provider may recommend screening tests for certain conditions. This may include: Lipid and cholesterol levels. Diabetes screening. This is done by checking your blood sugar (glucose) after you have not eaten for a while (fasting). Hepatitis B test. Hepatitis C test. HIV (human immunodeficiency virus) test. STI (sexually transmitted infection) testing, if you are at risk. Talk with your health care provider about your test results, treatment options, and if necessary, the need for more tests. Follow these instructions at home: Eating and drinking  Eat a healthy diet that includes fresh fruits and vegetables, whole grains, lean protein, and low-fat dairy products. Drink enough fluid to keep your urine pale yellow. Take vitamin and mineral supplements as recommended by your health care provider. Do not  drink alcohol if your health care provider tells you not to drink. If you drink alcohol: Limit how much you have to 0-2 drinks a day. Know how much alcohol is in your drink. In the [place] one drink equals one 12 oz bottle of beer (355 mL), one 5 oz glass of wine (148 mL), or one 1 oz glass of hard liquor (44 mL). Lifestyle Brush your teeth every morning and night with  fluoride toothpaste. Floss one time each day. Exercise for at least 30 minutes 5 or more days each week. Do not use any products that contain nicotine or tobacco. These products include cigarettes, chewing tobacco, and vaping devices, such as e-cigarettes. If you need help quitting, ask your health care provider. Do not use drugs. If you are sexually active, practice safe sex. Use a condom or other form of protection to prevent STIs. Find healthy ways to manage stress, such as: Meditation, yoga, or listening to music. Journaling. Talking to a trusted person. Spending time with friends and family. Minimize exposure to UV radiation to reduce your risk of skin cancer. Safety Always wear your seat belt while driving or riding in a vehicle. Do not drive: If you have been drinking alcohol. Do not ride with someone who has been drinking. If you have been using any mind-altering substances or drugs. While texting. When you are tired or distracted. Wear a helmet and other protective equipment during sports activities. If you have firearms in your house, make sure you follow all gun safety procedures. Seek help if you have been physically or sexually abused. What's next? Go to your health care provider once a year for an annual wellness visit. Ask your health care provider how often you should have your eyes and teeth checked. Stay up to date on all vaccines. This information is not intended to replace advice given to you by your health care provider. Make sure you discuss any questions you have with your health care provider. Document Revised: 03/31/2021 Document Reviewed: 03/31/2021 Elsevier Patient Education  [place].

## 2022-03-11 NOTE — Assessment & Plan Note (Signed)
Recent flare.  We will send in a one-time dose of Diflucan.  This is worked well for him in the past.  He will also continue using ketoconazole shampoo as needed.

## 2022-03-11 NOTE — Assessment & Plan Note (Signed)
Doing well on Zoloft 100 mg daily.  We will continue current dose.

## 2022-03-11 NOTE — Assessment & Plan Note (Signed)
Symptoms consistent with GERD.  We will try empiric Protonix 40 mg daily for the next 4 to 6 weeks.  If not improving would consider referral to GI for endoscopy.

## 2022-03-11 NOTE — Progress Notes (Signed)
Chief Complaint:  Charles Gordon is a 36 y.o. male who presents today for his annual comprehensive physical exam.    Assessment/Plan:  Chronic Problems Addressed Today: GERD (gastroesophageal reflux disease) Symptoms consistent with GERD.  We will try empiric Protonix 40 mg daily for the next 4 to 6 weeks.  If not improving would consider referral to GI for endoscopy.  Anxiety Doing well on Zoloft 100 mg daily.  We will continue current dose.  Tinea versicolor Recent flare.  We will send in a one-time dose of Diflucan.  This is worked well for him in the past.  He will also continue using ketoconazole shampoo as needed.  Preventative Healthcare: UTD on vaccines.   Patient Counseling(The following topics were reviewed and/or handout was given):  -Nutrition: Stressed importance of moderation in sodium/caffeine intake, saturated fat and cholesterol, caloric balance, sufficient intake of fresh fruits, vegetables, and fiber.  -Stressed the importance of regular exercise.   -Substance Abuse: Discussed cessation/primary prevention of tobacco, alcohol, or other drug use; driving or other dangerous activities under the influence; availability of treatment for abuse.   -Injury prevention: Discussed safety belts, safety helmets, smoke detector, smoking near bedding or upholstery.   -Sexuality: Discussed sexually transmitted diseases, partner selection, use of condoms, avoidance of unintended pregnancy and contraceptive alternatives.   -Dental health: Discussed importance of regular tooth brushing, flossing, and dental visits.  -Health maintenance and immunizations reviewed. Please refer to Health maintenance section.  Return to care in 1 year for next preventative visit.     Subjective:  HPI:  He has no acute complaints today.  See A/P for status of chronic conditions.  For the last several weeks he has had issues with sensation of food getting stuck near his stomach.  Several regurgitation  and reflux as well.  Sometimes has metallic taste in his mouth.  He is wondering if he may have reflux.  Lifestyle Diet: None specific.  Exercise: Will be getting into bike.      03/11/2022    1:16 PM  Depression screen PHQ 2/9  Decreased Interest 0  Down, Depressed, Hopeless 0  PHQ - 2 Score 0    There are no preventive care reminders to display for this patient.   ROS: Per HPI, otherwise a complete review of systems was negative.   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 2 tablet 0   fluticasone (FLONASE) 50 MCG/ACT nasal spray Place 2 sprays into both nostrils daily. 16 g 6   ketoconazole (NIZORAL) 2 % shampoo Apply 1 application topically 2 (two) times a week.  120 mL 5   pantoprazole (PROTONIX) 40 MG tablet Take 1 tablet (40 mg total) by mouth daily. 90 tablet 3   sertraline (ZOLOFT) 100 MG tablet TAKE 1 TABLET BY MOUTH ONCE DAILY 90 tablet 3   No current facility-administered medications for this visit.    Allergies-reviewed and updated Allergies  Allergen Reactions   Sulfa Antibiotics Swelling   Penicillins Rash    Social History   Socioeconomic  History   Marital status: Married    Spouse name: Not on file   Number of children: 3   Years of education: Not on file   Highest education level: Not on file  Occupational History   Not on file  Tobacco Use   Smoking status: Never   Smokeless tobacco: Never  Vaping Use   Vaping Use: Never used  Substance and Sexual Activity   Alcohol use: Never   Drug use: Never   Sexual activity: Not on file  Other Topics Concern   Not on file  Social History Narrative   Left Handed and uses right hand as well.    Lives in a one story home   Drinks caffeine    Social Determinants of Health   Financial Resource Strain: Not on file  Food Insecurity: Not on file  Transportation Needs: Not on file  Physical Activity: Not on file  Stress: Not on file  Social Connections: Not on file        Objective:  Physical Exam: BP 117/86 (BP Location: Left Arm)   Pulse 66   Temp 97.7 F (36.5 C) (Temporal)   Ht '5\' 9"'$  (1.753 m)   Wt 230 lb 6.4 oz (104.5 kg)   SpO2 97%   BMI 34.02 kg/m   Body mass index is 34.02 kg/m. Wt Readings from Last 3 Encounters:  03/11/22 230 lb 6.4 oz (104.5 kg)  04/27/21 191 lb (86.6 kg)  03/05/21 189 lb (85.7 kg)   Gen: NAD, resting comfortably HEENT: TMs normal bilaterally. OP clear. No thyromegaly noted.  CV: RRR with no murmurs appreciated Pulm: NWOB, CTAB with no crackles, wheezes, or rhonchi GI: Normal bowel sounds present. Soft, Nontender, Nondistended. MSK: no edema, cyanosis, or clubbing noted Skin: warm, dry Neuro: CN2-12 grossly intact. Strength 5/5 in upper and lower extremities. Reflexes symmetric and intact bilaterally.  Psych: Normal affect and thought content     Kaven Cumbie M. Jerline Pain, MD 03/11/2022 1:42 PM

## 2022-03-15 ENCOUNTER — Inpatient Hospital Stay: Payer: No Typology Code available for payment source | Attending: Genetic Counselor | Admitting: Genetic Counselor

## 2022-03-15 ENCOUNTER — Encounter: Payer: Self-pay | Admitting: Genetic Counselor

## 2022-03-15 DIAGNOSIS — Z8481 Family history of carrier of genetic disease: Secondary | ICD-10-CM

## 2022-03-15 NOTE — Progress Notes (Signed)
REFERRING PROVIDER: Vivi Barrack, MD [address] [place],  [place] [address]  PRIMARY PROVIDER:  Vivi Barrack, MD  PRIMARY REASON FOR VISIT:  1. Family history of gene mutation     HISTORY OF PRESENT ILLNESS:   Charles Gordon, a 36 y.o. male, was seen for a [place] cancer genetics consultation at the request of Dr. Jerline Pain due to a family history of a CHEK2 gene mutation.  Charles Gordon presents to clinic today to discuss the possibility of a hereditary predisposition to cancer, to discuss genetic testing, and to further clarify his future cancer risks, as well as potential cancer risks for family members.   CANCER HISTORY:  Charles Gordon is a 36 y.o. male with no personal history of cancer.     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    Past Surgical History:  Procedure Laterality Date   PILONIDAL CYST EXCISION  2003    Social History   Socioeconomic History   Marital status: Married    Spouse name: Not on file   Number of children: 3   Years of education: Not on file   Highest education level: Not on file  Occupational History   Not on file  Tobacco Use   Smoking status: Never   Smokeless tobacco: Never  Vaping Use   Vaping Use: Never used  Substance and Sexual Activity   Alcohol use: Never   Drug use: Never   Sexual activity: Not on file  Other Topics Concern   Not on file  Social History Narrative   Left Handed and uses right hand as well.    Lives in a one story home   Drinks caffeine    Social Determinants of Health   Financial Resource Strain: Not on file  Food Insecurity: Not on file  Transportation Needs: Not on file  Physical Activity: Not on file  Stress: Not on file  Social Connections: Not on file     FAMILY HISTORY:  We obtained a detailed, 4-generation family history.   Significant diagnoses are listed below: Family History  Problem Relation Age of Onset   Breast cancer Mother 47       CHEK2+   Colon cancer Maternal Grandmother 96   Prostate cancer Neg Hx      Charles Gordon mother was diagnosed with breast cancer at age 30 and recently had hereditary cancer genetic testing through [place]. 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 His maternal grandmother was diagnosed with colon cancer at age 95, she is currently 50. His paternal grandmother was diagnosed with brain cancer in her 6s and died in her 8s.   GENETIC COUNSELING ASSESSMENT: Charles Gordon is a 36 y.o. male with a family history of a CHEK2 gene mutation. Therefore, we discussed and recommended the following at today's visit.   DISCUSSION:  We reviewed the cancer risks and management recommendations for individuals with a CHEK2 gene mutation with a focus on management in males.   Cancer Risks for CHEK2: Women have a 24-48% lifetime risk of breast cancer. Men are thought to be at an increased risk of prostate cancer. The exact risk figure is unknown at this time. 8-9% lifetime risk of colorectal cancer  Management Recommendations:  Colon Cancer Screening: Colonoscopy screening  every 5 years beginning at age 36 If an individual has a first-degree relative with colorectal cancer, screening should begin 10 years prior to the relative's age at diagnosis if before 58.  Prostate Cancer Screening: Consider beginning annual PSA blood test and digital rectal exams at age 26.  Implications for Family Members: Hereditary predisposition to cancer due to pathogenic variants in the CHEK2 gene has autosomal dominant inheritance. This means that an individual with a pathogenic variant has a 50% chance of passing the condition on to his/her offspring. Identification of a pathogenic variant allows for the recognition of at-risk relatives who can pursue testing for the  familial variant.   Additional Considerations:  We discussed the option to pursue more comprehensive genetic testing. However, due to the limited family history of cancer, we do not recommend a pan-cancer panel at this time and Charles Gordon elected to have [place] site specific genetic testing.   Based on Charles Gordon family history of a CHEK2 gene mutation, he meets medical criteria for genetic testing. Despite that he meets criteria, he may still have an out of pocket cost. If his out of pocket cost for testing is over $100, the laboratory will call and confirm whether he wants to proceed with testing.  If the out of pocket cost of testing is less than $100 he will be billed by the genetic testing laboratory.   We discussed that some people do not want to undergo genetic testing due to fear of genetic discrimination.  A federal law called the Genetic Information Non-Discrimination Act (GINA) of 2008 helps protect individuals against genetic discrimination based on their genetic test results.  It impacts both health insurance and employment.  With health insurance, it protects against increased premiums, being kicked off insurance or being forced to take a test in order to be insured.  For employment it protects against hiring, firing and promoting decisions based on genetic test results.  GINA does not apply to those in the [employer], those who work for companies with less than 15 employees, and new life insurance or long-term disability insurance policies.  Health status due to a cancer diagnosis is not protected under GINA.  PLAN: After considering the risks, benefits, and limitations, Charles Gordon provided informed consent to pursue genetic testing and a saliva kit will be mailed to his home. The kit will then be sent to [facility] for CHEK2 site specific genetic testing. Results should be available within approximately 2-3 weeks' time, at which point they will be disclosed by telephone to  Charles Gordon, as will any additional recommendations warranted by these results. Charles Gordon will receive a summary of his genetic counseling visit and a copy of his results once available. This information will also be available in Epic.   Charles Gordon questions were answered to his satisfaction today. Our contact information was provided should additional questions or concerns arise. Thank you for the referral and allowing [place] to share in the care of your patient.   Lucille Passy, MS, [facility] Genetic Counselor [email or web address] (P) [phone]  The patient was seen for a total of 20 minutes in face-to-face genetic counseling.  The patient was seen alone.  Drs. Lindi Adie and/or Burr Medico were available to discuss this case as needed.  _______________________________________________________________________ For Office Staff:  Number of people involved in session: 1 Was an Intern/ student involved with case: no

## 2022-04-20 ENCOUNTER — Other Ambulatory Visit (HOSPITAL_COMMUNITY): Payer: Self-pay

## 2022-04-20 MED FILL — Sertraline HCl Tab 100 MG: ORAL | 90 days supply | Qty: 90 | Fill #3 | Status: AC

## 2022-04-22 ENCOUNTER — Telehealth: Payer: Self-pay | Admitting: Genetic Counselor

## 2022-04-22 NOTE — Telephone Encounter (Signed)
I attempted to contact Mr. Fakhouri to discuss his genetic testing results. He was unavailable. I will attempt to contact him at a later date.   Lucille Passy, MS, [facility] Genetic Counselor [email or web address] (P) [phone]

## 2022-04-25 ENCOUNTER — Encounter: Payer: Self-pay | Admitting: Genetic Counselor

## 2022-04-29 ENCOUNTER — Telehealth: Payer: Self-pay | Admitting: Genetic Counselor

## 2022-04-29 ENCOUNTER — Encounter: Payer: Self-pay | Admitting: Genetic Counselor

## 2022-04-29 DIAGNOSIS — Z1379 Encounter for other screening for genetic and chromosomal anomalies: Secondary | ICD-10-CM | POA: Insufficient documentation

## 2022-04-29 DIAGNOSIS — Z1509 Genetic susceptibility to other malignant neoplasm: Secondary | ICD-10-CM | POA: Insufficient documentation

## 2022-04-29 NOTE — Telephone Encounter (Signed)
I contacted Mr. Zwilling to discuss his genetic testing results. His test result is Positive. 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 Detailed clinic note to follow.  The test report has been scanned into EPIC and is located under the Molecular Pathology section of the Results Review tab.  A portion of the result report is included below for reference.   Lucille Passy, MS, [facility] Genetic Counselor [email or web address] (P) [phone]

## 2022-05-02 ENCOUNTER — Ambulatory Visit: Payer: Self-pay | Admitting: Genetic Counselor

## 2022-05-02 DIAGNOSIS — Z1379 Encounter for other screening for genetic and chromosomal anomalies: Secondary | ICD-10-CM

## 2022-05-02 NOTE — Progress Notes (Signed)
HPI:   Charles Gordon was previously seen in the [facility] due to a family history of a CHEK2 gene mutation. Please refer to our prior cancer genetics clinic note for more information regarding our discussion, assessment and recommendations, at the time. Charles Gordon recent genetic test results were disclosed to him, as were recommendations warranted by these results. These results and recommendations are discussed in more detail below.  CANCER HISTORY:  Oncology History   No history exists.    FAMILY HISTORY:  We obtained a detailed, 4-generation family history.  Significant diagnoses are listed below:      Family History  Problem Relation Age of Onset   Breast cancer Mother 59        CHEK2+   Colon cancer Maternal Grandmother 96   Prostate cancer Neg Hx         Charles Gordon mother was diagnosed with breast cancer at age 29 and recently had hereditary cancer genetic testing through [place]. 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 His maternal grandmother was diagnosed with colon cancer at age 43, she is currently 46. His paternal grandmother was diagnosed with brain cancer in her 25s and died in her 82s.   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 Specifically, this variant is [id number].  The test report has been scanned into EPIC and is located under the Molecular Pathology section of the Results Review tab.  A portion of the result report is included below for reference. Genetic testing reported out on 04/20/2022.       Cancer Risks for CHEK2: Women have a 24-48% lifetime risk of breast cancer. 8-9% lifetime risk of colorectal cancer Men are thought to be at an increased risk of prostate cancer. The exact risk figure is unknown at this time. Data suggests a possible increased risk for ovarian, endometrial,  thyroid, and other types of cancer    Research is continuing to help learn more about the cancers associated with CHEK2 pathogenic variants and what the exact risks are to develop these cancers.  Management Recommendations:  Breast Cancer Screening/Risk Reduction:  Women: Breast cancer screening includes: Breast awareness beginning at age 22 Monthly self-breast examination beginning at age 31 Clinical breast examination every 6-12 months beginning at age 24 or at the age of the earliest diagnosed breast cancer in the family, if onset was before age 2 Annual mammogram with consideration of tomosynthesis starting at age 58 or 71 years prior to the youngest age of diagnosis, whichever comes first Consider breast MRI with contrast starting at age 44-35 Evidence is insufficient for a prophylactic risk-reducing mastectomy, manage based on family history   Colon Cancer Screening: Colonoscopy screening every 5 years beginning at age 66 If an individual has a first-degree relative with colorectal cancer, screening should begin 10 years prior to the relative's age at diagnosis if before 26. If an individual has a personal history of colorectal cancer, screening recommendations should be based on recommendations for post-colorectal cancer resection.  Prostate Cancer Screening: Consider beginning annual PSA blood test and digital rectal exams at age 11.  This information is based on current understanding of the gene and may change in the future.  Implications for Family Members: Hereditary predisposition to cancer due to pathogenic variants in the CHEK2 gene has autosomal dominant inheritance. This means that an individual with a pathogenic variant has a  50% chance of passing the condition on to his/her offspring. Identification of a pathogenic variant allows for the recognition of at-risk relatives who can pursue testing for the familial variant.   Family members are encouraged to consider  genetic testing for this familial pathogenic variant. As there are generally no childhood cancer risks associated with pathogenic variants in the CHEK2 gene, individuals in the family are not recommended to have testing until they reach at least 37 years of age. They may contact our office at [phone] for more information or to schedule an appointment. Family members who live outside of the area are encouraged to find a genetic counselor in their area by visiting: [email or web address].   Resources: FORCE (Facing Our Risk of Cancer Empowered) is a resource for those with a hereditary predisposition to develop cancer.  FORCE provides information about risk reduction, advocacy, legislation, and clinical trials.  Additionally, FORCE provides a platform for collaboration and support; which includes: peer navigation, message boards, local support groups, a toll-free helpline, research registry and recruitment, advocate training, published medical research, webinars, brochures, mastectomy photos, and more.  For more information, visit www.facingourrisk.org  Our contact number was provided. Charles Gordon questions were answered to his satisfaction, and he knows he is welcome to call us at anytime with additional questions or concerns.   Lucille Passy, MS, [facility] Genetic Counselor [email or web address] (P) [phone]

## 2022-05-04 ENCOUNTER — Encounter: Payer: Self-pay | Admitting: Genetic Counselor

## 2022-06-30 ENCOUNTER — Other Ambulatory Visit: Payer: Self-pay | Admitting: Family Medicine

## 2022-06-30 ENCOUNTER — Other Ambulatory Visit (HOSPITAL_COMMUNITY): Payer: Self-pay

## 2022-06-30 DIAGNOSIS — J40 Bronchitis, not specified as acute or chronic: Secondary | ICD-10-CM

## 2022-06-30 DIAGNOSIS — R062 Wheezing: Secondary | ICD-10-CM

## 2022-06-30 MED ORDER — AZITHROMYCIN 250 MG PO TABS
ORAL_TABLET | ORAL | 0 refills | Status: AC
  Filled 2022-06-30: qty 6, 5d supply, fill #0

## 2022-06-30 MED ORDER — ALBUTEROL SULFATE HFA 108 (90 BASE) MCG/ACT IN AERS
2.0000 | INHALATION_SPRAY | Freq: Four times a day (QID) | RESPIRATORY_TRACT | 0 refills | Status: DC | PRN
Start: 2022-06-30 — End: 2023-06-30
  Filled 2022-06-30: qty 6.7, 25d supply, fill #0

## 2022-07-11 ENCOUNTER — Encounter: Payer: Self-pay | Admitting: *Deleted

## 2022-07-12 ENCOUNTER — Other Ambulatory Visit: Payer: Self-pay | Admitting: Family Medicine

## 2022-07-12 ENCOUNTER — Other Ambulatory Visit (HOSPITAL_COMMUNITY): Payer: Self-pay

## 2022-07-12 MED ORDER — SERTRALINE HCL 100 MG PO TABS
ORAL_TABLET | Freq: Every day | ORAL | 3 refills | Status: DC
  Filled 2022-07-12: qty 90, 90d supply, fill #0
  Filled 2022-08-23 – 2022-10-05 (×2): qty 90, 90d supply, fill #1
  Filled 2022-12-30: qty 90, 90d supply, fill #2
  Filled 2023-03-28: qty 90, 90d supply, fill #3

## 2022-07-18 ENCOUNTER — Ambulatory Visit: Payer: No Typology Code available for payment source | Admitting: Family Medicine

## 2022-07-19 ENCOUNTER — Encounter: Payer: Self-pay | Admitting: Gastroenterology

## 2022-07-19 ENCOUNTER — Other Ambulatory Visit (HOSPITAL_COMMUNITY): Payer: Self-pay

## 2022-07-19 ENCOUNTER — Other Ambulatory Visit: Payer: Self-pay | Admitting: Family Medicine

## 2022-07-19 ENCOUNTER — Ambulatory Visit (INDEPENDENT_AMBULATORY_CARE_PROVIDER_SITE_OTHER): Payer: No Typology Code available for payment source | Admitting: Family Medicine

## 2022-07-19 ENCOUNTER — Encounter: Payer: Self-pay | Admitting: Family Medicine

## 2022-07-19 VITALS — BP 120/78 | HR 73 | Temp 98.0°F | Ht 69.0 in | Wt 200.6 lb

## 2022-07-19 DIAGNOSIS — K219 Gastro-esophageal reflux disease without esophagitis: Secondary | ICD-10-CM

## 2022-07-19 DIAGNOSIS — R052 Subacute cough: Secondary | ICD-10-CM | POA: Diagnosis not present

## 2022-07-19 MED ORDER — LEVOFLOXACIN 500 MG PO TABS
500.0000 mg | ORAL_TABLET | Freq: Every day | ORAL | 0 refills | Status: DC
  Filled 2022-07-19: qty 7, 7d supply, fill #0

## 2022-07-19 MED ORDER — PULMICORT FLEXHALER 180 MCG/ACT IN AEPB
1.0000 | INHALATION_SPRAY | Freq: Two times a day (BID) | RESPIRATORY_TRACT | 5 refills | Status: DC
  Filled 2022-07-19 (×2): qty 1, 60d supply, fill #0

## 2022-07-19 MED ORDER — FLUTICASONE PROPIONATE 50 MCG/ACT NA SUSP
2.0000 | Freq: Every day | NASAL | 6 refills | Status: DC
  Filled 2022-07-19: qty 16, 30d supply, fill #0

## 2022-07-19 NOTE — Patient Instructions (Signed)
It was very nice to see you today!  I think you probably have an atypical pneumonia.  We will get an x-ray to confirm.  I will send in prescription for Levaquin.  We may need to send you to pulmonology.  I will send in Pulmicort as well.  I will refer you to GI for an EGD.    Please follow-up with me in a few weeks limit how you doing.  Take care, Dr Jerline Pain  PLEASE NOTE:  If you had any lab tests please let us know if you have not heard back within a few days. You may see your results on mychart before we have a chance to review them but we will give you a call once they are reviewed by [place]. If we ordered any referrals today, please let us know if you have not heard from their office within the next week.   Please try these tips to maintain a healthy lifestyle:  Eat at least 3 REAL meals and 1-2 snacks per day.  Aim for no more than 5 hours between eating.  If you eat breakfast, please do so within one hour of getting up.   Each meal should contain half fruits/vegetables, one quarter protein, and one quarter carbs (no bigger than a computer mouse)  Cut down on sweet beverages. This includes juice, soda, and sweet tea.   Drink at least 1 glass of water with each meal and aim for at least 8 glasses per day  Exercise at least 150 minutes every week.

## 2022-07-19 NOTE — Assessment & Plan Note (Signed)
Symptoms are still not controlled.  He has been on Protonix 40 mg daily for the last week or so with some modest improvement though still has ongoing dysphagia symptoms.  We will place referral to GI for consideration for EGD due to his dysphagia.

## 2022-07-19 NOTE — Progress Notes (Signed)
   Charles Gordon is a 36 y.o. male who presents today for an office visit.  Assessment/Plan:  New/Acute Problems: Cough Exam concerning for possible atypical pneumonia or bronchitis.  Given that symptoms have been persistent for the past month we will check x-ray to further evaluate.  We will also send in prescription for Levaquin instructed patient to not start this until we get his chest x-ray to confirm that he has an infection.  If his x-ray is clear would consider referral to pulmonology for evaluation for adult onset asthma.    Chronic Problems Addressed Today: GERD (gastroesophageal reflux disease) Symptoms are still not controlled.  He has been on Protonix 40 mg daily for the last week or so with some modest improvement though still has ongoing dysphagia symptoms.  We will place referral to GI for consideration for EGD due to his dysphagia.     Subjective:  HPI:  Patient here with cough and wheezing. This started about a month ago. Initially had a lot of congestion which has persisted. Tried taking albuterol inhaler and zpack. Inhaler has helped modestly. Did not feel like the antibiotic helped. No fevers or chills. Some shortness of breath in the morning.  No malaise.  Family was sick with similar symptoms about a month ago but they have all recovered.       Objective:  Physical Exam: BP 120/78   Pulse 73   Temp 98 F (36.7 C) (Temporal)   Ht '5\' 9"'$  (1.753 m)   Wt 200 lb 9.6 oz (91 kg)   SpO2 95%   BMI 29.62 kg/m   Gen: No acute distress, resting comfortably CV: Regular rate and rhythm with no murmurs appreciated Pulm: Normal work of breathing, respiratory and and expiratory wheezes noted in all lung fields.  Diffuse rhonchi. Neuro: Grossly normal, moves all extremities Psych: Normal affect and thought content      Issabelle Mcraney M. Jerline Pain, MD 07/19/2022 8:00 AM

## 2022-07-20 ENCOUNTER — Encounter: Payer: Self-pay | Admitting: Family Medicine

## 2022-07-20 ENCOUNTER — Other Ambulatory Visit (HOSPITAL_COMMUNITY): Payer: Self-pay

## 2022-07-20 ENCOUNTER — Ambulatory Visit (INDEPENDENT_AMBULATORY_CARE_PROVIDER_SITE_OTHER)
Admission: RE | Admit: 2022-07-20 | Discharge: 2022-07-20 | Disposition: A | Discharge status: Home / Self Care | Payer: No Typology Code available for payment source | Source: Ambulatory Visit | Attending: Family Medicine | Admitting: Family Medicine

## 2022-07-20 DIAGNOSIS — R052 Subacute cough: Secondary | ICD-10-CM | POA: Diagnosis not present

## 2022-07-20 DIAGNOSIS — R062 Wheezing: Secondary | ICD-10-CM

## 2022-07-20 NOTE — Telephone Encounter (Signed)
Please advise 

## 2022-07-22 ENCOUNTER — Other Ambulatory Visit (HOSPITAL_COMMUNITY): Payer: Self-pay

## 2022-07-22 MED ORDER — FLUTICASONE PROPIONATE HFA 110 MCG/ACT IN AERO
1.0000 | INHALATION_SPRAY | Freq: Two times a day (BID) | RESPIRATORY_TRACT | 99 refills | Status: DC
  Filled 2022-07-25: qty 12, 30d supply, fill #0

## 2022-07-22 MED ORDER — FLUTICASONE PROPIONATE 50 MCG/ACT NA SUSP
2.0000 | Freq: Every day | NASAL | 6 refills | Status: DC
  Filled 2022-07-22 – 2022-10-05 (×3): qty 16, 30d supply, fill #0
  Filled 2022-12-30: qty 16, 30d supply, fill #1

## 2022-07-22 NOTE — Addendum Note (Signed)
Addended by: Bing Neighbors on: 07/22/2022 02:29 PM   Modules accepted: Orders

## 2022-07-22 NOTE — Progress Notes (Signed)
See mychart message to patient.

## 2022-07-25 ENCOUNTER — Other Ambulatory Visit (HOSPITAL_COMMUNITY): Payer: Self-pay

## 2022-07-27 ENCOUNTER — Other Ambulatory Visit (HOSPITAL_COMMUNITY): Payer: Self-pay

## 2022-08-12 ENCOUNTER — Other Ambulatory Visit (HOSPITAL_COMMUNITY): Payer: Self-pay

## 2022-08-23 ENCOUNTER — Other Ambulatory Visit (HOSPITAL_COMMUNITY): Payer: Self-pay

## 2022-08-24 ENCOUNTER — Other Ambulatory Visit (HOSPITAL_COMMUNITY): Payer: Self-pay

## 2022-08-25 ENCOUNTER — Other Ambulatory Visit (HOSPITAL_COMMUNITY): Payer: Self-pay

## 2022-08-26 ENCOUNTER — Other Ambulatory Visit (HOSPITAL_COMMUNITY): Payer: Self-pay

## 2022-08-26 ENCOUNTER — Ambulatory Visit (INDEPENDENT_AMBULATORY_CARE_PROVIDER_SITE_OTHER): Payer: No Typology Code available for payment source | Admitting: Student

## 2022-08-26 ENCOUNTER — Encounter: Payer: Self-pay | Admitting: Student

## 2022-08-26 ENCOUNTER — Telehealth: Payer: Self-pay | Admitting: Student

## 2022-08-26 ENCOUNTER — Encounter (HOSPITAL_COMMUNITY): Payer: Self-pay

## 2022-08-26 VITALS — BP 104/70 | HR 70 | Ht 69.0 in | Wt 200.0 lb

## 2022-08-26 DIAGNOSIS — R053 Chronic cough: Secondary | ICD-10-CM | POA: Diagnosis not present

## 2022-08-26 NOTE — Telephone Encounter (Signed)
What is least expensive ICS for him, LABA/ICS?  Thanks!

## 2022-08-26 NOTE — Telephone Encounter (Signed)
Per benefits investigation the least expensive ICS at this time under the patients insurance would be Flovent Diskus at a $38.26 co-pay.  The least expensive ICS/LABA would be Brand Advair Diskus at a $5.00 co-pay

## 2022-08-26 NOTE — Patient Instructions (Signed)
-   would continue flovent for at least 8 weeks rinse mouth after use - PFT in 3 months need to stop flovent for 3 weeks before PFT - albuterol as needed or 5-20 minutes before exercise

## 2022-08-26 NOTE — Progress Notes (Signed)
Synopsis: Referred for subacute cough by Vivi Barrack, MD  Subjective:   PATIENT ID: Charles Gordon GENDER: male DOB: [birth year], MRN: [id number]  Chief Complaint  Patient presents with   Pulmonary Consult    Referred by Dimas Chyle, MD.  Pt c/o wheezing after he developed a cold end of September 2023- was started on flovent and this has helped.    36yM with history of DVT UE, CHEK2+   Had visit with PCP 10/4 at which had 1 month cough/wheeze along with persistent sinus congestion. Even woke wife up from sleep. Tried azithro - little improvement, albuterol - moderate improvement.   PCP prescribed levaquin and budesonide inhaler, had also just started ppi (has had dysphagia for last 8-9 months) and was referred to GI.   Never had asthma or eczema as a kid.   Within a week of starting flovent felt much better with less wheeze. Flovent is about $50 out of pocket and so now using just once daily if he can get away with it. Little to no cough. No limitation riding bike to the hospital now that he's on flovent.   He doesn't have sinus congestion now. Had previously used flonase on and off but now that he's using it more consistently less sinonasal congestion. He has noticed seasonal allergy in past but not formally tested.   Otherwise pertinent review of systems is negative.  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 No smoking - long time ago very limited. No vaping. No MJ in a long time. 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     Family History  Problem Relation Age of Onset   Breast cancer Mother 62       CHEK2+   Colon cancer Maternal Grandmother 96    Prostate cancer Neg Hx      Past Surgical History:  Procedure Laterality Date   PILONIDAL CYST EXCISION  2003    Social History   Socioeconomic History   Marital status: Married    Spouse name: Not on file   Number of children: 3   Years of education: Not on file   Highest education level: Not on file  Occupational History   Not on file  Tobacco Use   Smoking status: Never   Smokeless tobacco: Never  Vaping Use   Vaping Use: Never used  Substance and Sexual Activity   Alcohol use: Never   Drug use: Never   Sexual activity: Not on file  Other Topics Concern   Not on file  Social History Narrative   Left Handed and uses right hand as well.    Lives in a one story home   Drinks caffeine    Social Determinants of Health   Financial Resource Strain: Not on file  Food Insecurity: Not on file  Transportation Needs: Not on file  Physical Activity: Not on file  Stress: Not on file  Social Connections: Not on file  Intimate Partner Violence: Not on file     Allergies  Allergen Reactions   Sulfa Antibiotics Swelling   Penicillins Rash     Outpatient Medications  Prior to Visit  Medication Sig Dispense Refill   albuterol (VENTOLIN HFA) 108 (90 Base) MCG/ACT inhaler Inhale 2 puffs into the lungs every 6 (six) hours as needed for wheezing or shortness of breath. 6.7 g 0   fluticasone (FLONASE) 50 MCG/ACT nasal spray Place 2 sprays into both nostrils daily. 16 g 6   fluticasone (FLOVENT HFA) 110 MCG/ACT inhaler Inhale 1 puff into the lungs in the morning and in the evening 12 g PRN   ketoconazole (NIZORAL) 2 % shampoo Apply 1 application topically 2 (two) times a week. 120 mL 5   pantoprazole (PROTONIX) 40 MG tablet Take 1 tablet (40 mg total) by mouth daily. 90 tablet 3   sertraline (ZOLOFT) 100 MG tablet TAKE 1 TABLET BY MOUTH ONCE DAILY 90 tablet 3   budesonide (PULMICORT FLEXHALER) 180 MCG/ACT inhaler Inhale 1 puff into the lungs in the morning and at bedtime. 1 each 5    levofloxacin (LEVAQUIN) 500 MG tablet Take 1 tablet (500 mg total) by mouth daily. 7 tablet 0   No facility-administered medications prior to visit.       Objective:   Physical Exam:  General appearance: 36 y.o., male, NAD, conversant  Eyes: anicteric sclerae; PERRL, tracking appropriately HENT: NCAT; MMM Neck: Trachea midline; no lymphadenopathy, no JVD Lungs: CTAB, no crackles, no wheeze, with normal respiratory effort CV: RRR, no murmur  Abdomen: Soft, non-tender; non-distended, BS present  Extremities: No peripheral edema, warm Skin: Normal turgor and texture; no rash Psych: Appropriate affect Neuro: Alert and oriented to person and place, no focal deficit     Vitals:   08/26/22 1433  BP: 104/70  Pulse: 70  SpO2: 98%  Weight: 200 lb (90.7 kg)  Height: _0  (1.753 m)   98% on RA BMI Readings from Last 3 Encounters:  08/26/22 29.53 kg/m  07/19/22 29.62 kg/m  03/11/22 34.02 kg/m   Wt Readings from Last 3 Encounters:  08/26/22 200 lb (90.7 kg)  07/19/22 200 lb 9.6 oz (91 kg)  03/11/22 230 lb 6.4 oz (104.5 kg)     CBC    Component Value Date/Time   WBC 7.1 01/03/2020 1326   RBC 4.73 01/03/2020 1326   HGB 14.8 01/03/2020 1326   HCT 42.6 01/03/2020 1326   PLT 177.0 01/03/2020 1326   MCV 90.0 01/03/2020 1326   MCHC 34.7 01/03/2020 1326   RDW 13.4 01/03/2020 1326     Chest Imaging: CXR 07/20/22 with hyperinflation - 7 anterior ribs seen bl  Pulmonary Functions Testing Results:     No data to display              Assessment & Plan:   # Subacute/chronic cough, wheeze, dyspnea  Suspect now controlled persistent asthma vs non-asthmatic eosinophilic bronchitis (maybe on spectrum of EOE if that's what ends up explaining his dysphagia).   Plan: - recommend continuing flovent for at least 8 weeks in case this is NAEB (minimum duration needed to reduce risk of recurrence) - pre/post bronchodilator spirometry - needs to hold flovent or any ICS  for at least 3 weeks before PFT - RTC 3 months with PFT - consider cbc/diff, IgE/allergy panel next visit     Maryjane Hurter, MD [facility] 08/26/2022 2:37 PM

## 2022-09-02 ENCOUNTER — Encounter: Payer: Self-pay | Admitting: Gastroenterology

## 2022-09-02 ENCOUNTER — Ambulatory Visit (INDEPENDENT_AMBULATORY_CARE_PROVIDER_SITE_OTHER): Payer: No Typology Code available for payment source | Admitting: Gastroenterology

## 2022-09-02 VITALS — BP 108/72 | HR 64 | Ht 69.0 in | Wt 202.0 lb

## 2022-09-02 DIAGNOSIS — R1310 Dysphagia, unspecified: Secondary | ICD-10-CM | POA: Diagnosis not present

## 2022-09-02 DIAGNOSIS — K219 Gastro-esophageal reflux disease without esophagitis: Secondary | ICD-10-CM

## 2022-09-02 DIAGNOSIS — Z1501 Genetic susceptibility to malignant neoplasm of breast: Secondary | ICD-10-CM | POA: Diagnosis not present

## 2022-09-02 DIAGNOSIS — Z1503 Genetic susceptibility to malignant neoplasm of prostate: Secondary | ICD-10-CM

## 2022-09-02 DIAGNOSIS — Z1509 Genetic susceptibility to other malignant neoplasm: Secondary | ICD-10-CM

## 2022-09-02 DIAGNOSIS — Z1589 Genetic susceptibility to other disease: Secondary | ICD-10-CM

## 2022-09-02 NOTE — Progress Notes (Signed)
HPI : Charles Gordon is a very pleasant 36 year old male with recently diagnosed CHEK2 mutation who is referred to [place] by Dr. Dimas Chyle for further evaluation of dysphagia and GERD symptoms.  Patient states that he started noticing difficulty swallowing in the past year or so.  He had the sensation that food was sitting in his chest when he swallowed which resolved with drinking water to wash it down.  This symptom became more noticeable and frequent despite modifying how he ate (eating smaller bites, chewing more carefully).  He did not have any episodes of food getting stuck that required forced emesis.  Dysphagia noted only with solids, no liquid dysphagia or trouble swallowing foods like yogurt. He also had intermittent reflux symptoms of heartburn which would come and go.  He was taking Protonix intermittently for many months which did not seem to help much, but he started taking it daily about 2 months ago and this has improved both his swallowing and his reflux symptoms. His weight has been stable. He has also been recently evaluated for a chronic cough which may be asthma related.  This is improved with addition of maintenance inhalers. He has a history of seasonal allergies, but no other atopic history. His mother had allergies as well as asthma versus COPD (heavy smoker)  His mother was diagnosed with breast cancer and tested positive for a CHEK2 mutation.  The patient subsequently underwent genetic testing and was also found to be positive for CHEK2 mutation.  The patient underwent a colonoscopy in 2019 to evaluate hematochezia and weight loss.  The colonoscopy was notable only for a diminutive hyperplastic polyp and internal hemorrhoids.  He has regular bowel movements currently, no problems with abdominal pain, constipation, diarrhea or hematochezia.  He has a maternal grandmother with history of colon cancer, but no first-degree relatives.   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     Past Surgical History:  Procedure Laterality Date   PILONIDAL CYST EXCISION  2003   Family History  Problem Relation Age of Onset   Breast cancer Mother 32       CHEK2+   Colon cancer Maternal Grandmother 96   Prostate cancer Neg Hx    Social History   Tobacco Use   Smoking status: Never   Smokeless tobacco: Never  Vaping Use   Vaping Use: Never used  Substance Use Topics   Alcohol use: Never   Drug use: Never   Current Outpatient Medications  Medication Sig Dispense Refill   albuterol (VENTOLIN HFA) 108 (90 Base) MCG/ACT inhaler Inhale 2 puffs into the lungs every 6 (six) hours as needed for wheezing or shortness of breath. 6.7 g 0   fluticasone (FLONASE) 50 MCG/ACT nasal spray Place 2 sprays into both nostrils daily. 16 g 6   fluticasone (FLOVENT HFA) 110 MCG/ACT inhaler Inhale 1 puff into the lungs in the morning and in the evening 12 g PRN   ketoconazole (NIZORAL) 2 % shampoo Apply 1 application topically 2 (two) times a week. 120 mL 5   pantoprazole (PROTONIX) 40 MG tablet Take 1 tablet (40 mg total) by mouth daily. 90 tablet 3   sertraline (ZOLOFT) 100 MG tablet TAKE 1 TABLET BY MOUTH ONCE DAILY 90 tablet 3   No current facility-administered medications for  this visit.   Allergies  Allergen Reactions   Sulfa Antibiotics Swelling   Penicillins Rash     Review of Systems: All systems reviewed and negative except where noted in HPI.    No results found.  Physical Exam: BP 108/72   Pulse 64   Ht _0  (1.753 m)   Wt 202 lb (91.6 kg)   BMI 29.83 kg/m  Constitutional: Pleasant,well-developed, Caucasian male in no acute distress. HEENT: Normocephalic and atraumatic. Conjunctivae are normal. No scleral icterus. Cardiovascular: Normal rate, regular rhythm.  Pulmonary/chest: Effort normal  and breath sounds normal. No wheezing, rales or rhonchi. Abdominal: Soft, nondistended, nontender. Bowel sounds active throughout. There are no masses palpable. No hepatomegaly. Extremities: no edema Neurological: Alert and oriented to person place and time. Skin: Skin is warm and dry. No rashes noted. Psychiatric: Normal mood and affect. Behavior is normal.  CBC    Component Value Date/Time   WBC 7.1 01/03/2020 1326   RBC 4.73 01/03/2020 1326   HGB 14.8 01/03/2020 1326   HCT 42.6 01/03/2020 1326   PLT 177.0 01/03/2020 1326   MCV 90.0 01/03/2020 1326   MCHC 34.7 01/03/2020 1326   RDW 13.4 01/03/2020 1326    CMP     Component Value Date/Time   NA 137 01/03/2020 1326   K 3.9 01/03/2020 1326   CL 103 01/03/2020 1326   CO2 30 01/03/2020 1326   GLUCOSE 100 (H) 01/03/2020 1326   BUN 13 01/03/2020 1326   CREATININE 0.88 01/03/2020 1326   CALCIUM 9.4 01/03/2020 1326     ASSESSMENT AND PLAN: 36 year old male with 1 year history of solid dysphagia and occasional typical GERD symptoms, both improved with daily PPI.  He has questionable new diagnosis of asthma versus eosinophilic pneumonitis.  Differential for his dysphagia includes eosinophilic esophagitis as well as peptic stricture/Schatzki ring versus symptomatic GERD.  An upper endoscopy is warranted to further evaluate the cause of his symptoms. Given the patient's Chek 2 mutation, he is at increased risk for colon cancer.  Is recommended that he undergo a colonoscopy at age 69 (2026), and then every 5 years.  Dysphagia - EGD  Chek2 mutation - Colonoscopy at age 53, then every 5 years  The details, risks (including bleeding, perforation, infection, missed lesions, medication reactions and possible hospitalization or surgery if complications occur), benefits, and alternatives to EGD with possible biopsy and possible dilation were discussed with the patient and he consents to proceed.   Sherron Mummert E. Candis Schatz, MD [facility]  CC:  Vivi Barrack, MD

## 2022-09-02 NOTE — Patient Instructions (Signed)
_______________________________________________________  If you are age 37 or older, your body mass index should be between 23-30. Your Body mass index is 29.83 kg/m. If this is out of the aforementioned range listed, please consider follow up with your Primary Care Provider.  If you are age 60 or younger, your body mass index should be between 19-25. Your Body mass index is 29.83 kg/m. If this is out of the aformentioned range listed, please consider follow up with your Primary Care Provider.   You have been scheduled for an endoscopy. Please follow written instructions given to you at your visit today. If you use inhalers (even only as needed), please bring them with you on the day of your procedure.  The [facility] providers would like to encourage you to use [facility] to communicate with providers for non-urgent requests or questions.  Due to long hold times on the telephone, sending your provider a message by [facility] may be a faster and more efficient way to get a response.  Please allow 48 business hours for a response.  Please remember that this is for non-urgent requests.   It was a pleasure to see you today!  Thank you for trusting me with your gastrointestinal care!    Scott E.Candis Schatz, MD

## 2022-09-05 ENCOUNTER — Other Ambulatory Visit (HOSPITAL_COMMUNITY): Payer: Self-pay

## 2022-10-06 ENCOUNTER — Other Ambulatory Visit (HOSPITAL_COMMUNITY): Payer: Self-pay

## 2022-10-06 ENCOUNTER — Other Ambulatory Visit: Payer: Self-pay

## 2022-10-07 ENCOUNTER — Other Ambulatory Visit: Payer: Self-pay | Admitting: Obstetrics and Gynecology

## 2022-10-07 ENCOUNTER — Other Ambulatory Visit (HOSPITAL_COMMUNITY): Payer: Self-pay

## 2022-10-07 DIAGNOSIS — J329 Chronic sinusitis, unspecified: Secondary | ICD-10-CM

## 2022-10-07 MED ORDER — DOXYCYCLINE HYCLATE 100 MG PO CAPS
100.0000 mg | ORAL_CAPSULE | Freq: Two times a day (BID) | ORAL | 0 refills | Status: AC
  Filled 2022-10-07: qty 14, 7d supply, fill #0

## 2022-10-21 ENCOUNTER — Encounter: Payer: Self-pay | Admitting: Gastroenterology

## 2022-10-21 ENCOUNTER — Ambulatory Visit (AMBULATORY_SURGERY_CENTER): Payer: 59 | Admitting: Gastroenterology

## 2022-10-21 VITALS — BP 102/66 | HR 66 | Temp 98.6°F | Resp 11 | Ht 69.0 in | Wt 202.0 lb

## 2022-10-21 DIAGNOSIS — R1310 Dysphagia, unspecified: Secondary | ICD-10-CM

## 2022-10-21 DIAGNOSIS — K222 Esophageal obstruction: Secondary | ICD-10-CM | POA: Diagnosis not present

## 2022-10-21 DIAGNOSIS — K2289 Other specified disease of esophagus: Secondary | ICD-10-CM | POA: Diagnosis not present

## 2022-10-21 MED ORDER — SODIUM CHLORIDE 0.9 % IV SOLN: 500.0000 mL | Freq: Once | INTRAVENOUS | Status: DC

## 2022-10-21 NOTE — Progress Notes (Signed)
To pacu, vss. Report to [email or web address] 

## 2022-10-21 NOTE — Patient Instructions (Addendum)
Handouts/ information given for post dilation diet, hiatal hernia and esophageal stenosis.  YOU HAD AN ENDOSCOPIC PROCEDURE TODAY AT [facility]:   Refer to the procedure report that was given to you for any specific questions about what was found during the examination.  If the procedure report does not answer your questions, please call your gastroenterologist to clarify.  If you requested that your care partner not be given the details of your procedure findings, then the procedure report has been included in a sealed envelope for you to review at your convenience later.  YOU SHOULD EXPECT: Some feelings of bloating in the abdomen. Passage of more gas than usual.  Walking can help get rid of the air that was put into your GI tract during the procedure and reduce the bloating. If you had a lower endoscopy (such as a colonoscopy or flexible sigmoidoscopy) you may notice spotting of blood in your stool or on the toilet paper. If you underwent a bowel prep for your procedure, you may not have a normal bowel movement for a few days.  Please Note:  You might notice some irritation and congestion in your nose or some drainage.  This is from the oxygen used during your procedure.  There is no need for concern and it should clear up in a day or so.  SYMPTOMS TO REPORT IMMEDIATELY:  Following upper endoscopy (EGD)  Vomiting of blood or coffee ground material  New chest pain or pain under the shoulder blades  Painful or persistently difficult swallowing  New shortness of breath  Fever of 100F or higher  Black, tarry-looking stools  For urgent or emergent issues, a gastroenterologist can be reached at any hour by calling [phone]. Do not use MyChart messaging for urgent concerns.    DIET:  SEE POST DILATION DIET HANDOUT, CLEAR LIQUIDS FOR ONE HOUR (UNTIL 330 pm) THEN SOFT DIET UNTIL TOMORROW AFTERNOON.  Drink plenty of fluids but you should avoid alcoholic beverages for 24  hours.  ACTIVITY:  You should plan to take it easy for the rest of today and you should NOT DRIVE or use heavy machinery until tomorrow (because of the sedation medicines used during the test).    FOLLOW UP: Our staff will call the number listed on your records the next business day following your procedure.  We will call around 7:15- 8:00 am to check on you and address any questions or concerns that you may have regarding the information given to you following your procedure. If we do not reach you, we will leave a message.     If any biopsies were taken you will be contacted by phone or by letter within the next 1-3 weeks.  Please call us at [phone] if you have not heard about the biopsies in 3 weeks.    SIGNATURES/CONFIDENTIALITY: You and/or your care partner have signed paperwork which will be entered into your electronic medical record.  These signatures attest to the fact that that the information above on your After Visit Summary has been reviewed and is understood.  Full responsibility of the confidentiality of this discharge information lies with you and/or your care-partner.

## 2022-10-21 NOTE — Progress Notes (Signed)
[facility] History and Physical   Primary Care Physician:  Vivi Barrack, MD   Reason for Procedure:   Dysphagia, GERD symptoms  Plan:    EGD     HPI: Charles Gordon is a 37 y.o. male undergoing EGD to evaluate chronic nonprogressive solid dysphagia, also with occasional GERD symptoms, improved with once daily pantoprazole.    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 Authorizing Provider  fluticasone (FLONASE) 50 MCG/ACT nasal spray Place 2 sprays into both nostrils daily. 07/22/22  Yes Vivi Barrack, MD  pantoprazole (PROTONIX) 40 MG tablet Take 1 tablet (40 mg total) by mouth daily. 03/11/22  Yes Vivi Barrack, MD  sertraline (ZOLOFT) 100 MG tablet TAKE 1 TABLET BY MOUTH ONCE DAILY 07/12/22 07/12/23 Yes Vivi Barrack, MD  albuterol (VENTOLIN HFA) 108 (90 Base) MCG/ACT inhaler Inhale 2 puffs into the lungs every 6 (six) hours as needed for wheezing or shortness of breath. 06/30/22   Caren Macadam, MD  fluticasone (FLOVENT HFA) 110 MCG/ACT inhaler Inhale 1 puff into the lungs in the morning and in the evening 07/22/22   Vivi Barrack, MD    Current Outpatient Medications  Medication Sig Dispense Refill   fluticasone (FLONASE) 50 MCG/ACT nasal spray Place 2 sprays into both nostrils daily. 16 g 6   pantoprazole (PROTONIX) 40 MG tablet Take 1 tablet (40 mg total) by mouth daily. 90 tablet 3   sertraline (ZOLOFT) 100 MG tablet TAKE 1 TABLET BY MOUTH ONCE DAILY 90 tablet 3   albuterol (VENTOLIN HFA) 108 (90 Base) MCG/ACT inhaler Inhale 2 puffs into the lungs every 6 (six) hours as needed for wheezing or shortness of breath. 6.7  g 0   fluticasone (FLOVENT HFA) 110 MCG/ACT inhaler Inhale 1 puff into the lungs in the morning and in the evening 12 g PRN   Current Facility-Administered Medications  Medication Dose Route Frequency Provider Last Rate Last Admin   0.9 %  sodium chloride infusion  500 mL Intravenous Once Daryel November, MD        Allergies as of 10/21/2022 - Review Complete 10/21/2022  Allergen Reaction Noted   Sulfa antibiotics Swelling 07/25/2019   Penicillins Rash 07/25/2019    Family History  Problem Relation Age of Onset   Breast cancer Mother 65       CHEK2+   Colon cancer Maternal Grandmother 96   Prostate cancer Neg Hx     Social History   Socioeconomic History   Marital status: Married    Spouse name: Not on file   Number of children: 3   Years of education: Not on file   Highest education level: Not on file  Occupational History   Not on file  Tobacco Use   Smoking status: Never   Smokeless tobacco: Never  Vaping Use   Vaping Use: Never used  Substance and Sexual Activity   Alcohol use: Never   Drug use: Never   Sexual activity: Not on file  Other Topics Concern   Not on file  Social History Narrative   Left Handed and uses  right hand as well.    Lives in a one story home   Drinks caffeine    Social Determinants of Health   Financial Resource Strain: Not on file  Food Insecurity: Not on file  Transportation Needs: Not on file  Physical Activity: Not on file  Stress: Not on file  Social Connections: Not on file  Intimate Partner Violence: Not on file    Review of Systems:  All other review of systems negative except as mentioned in the HPI.  Physical Exam: Vital signs BP 115/79   Pulse 70   Temp 98.6 F (37 C)   Ht '5\' 9"'$  (1.753 m)   Wt 202 lb (91.6 kg)   SpO2 100%   BMI 29.83 kg/m   General:   Alert,  Well-developed, well-nourished, pleasant and cooperative in NAD Airway:  Mallampati 2 Lungs:  Clear throughout to auscultation.   Heart:   Regular rate and rhythm; no murmurs, clicks, rubs,  or gallops. Abdomen:  Soft, nontender and nondistended. Normal bowel sounds.   Neuro/Psych:  Normal mood and affect. A and O x 3   Osborn Pullin E. Candis Schatz, MD [facility]

## 2022-10-21 NOTE — Progress Notes (Signed)
Called to room to assist during endoscopic procedure.  Patient ID and intended procedure confirmed with present staff. Received instructions for my participation in the procedure from the performing physician.  

## 2022-10-21 NOTE — Op Note (Signed)
[facility] Patient Name: Charles Gordon Procedure Date: 10/21/2022 1:31 PM MRN: [id number] Endoscopist: Nicki Reaper E. Candis Gordon , MD, [id number] Age: 37 Referring MD:  Date of Birth: Sep 22, 1986 Gender: Male Account #: [id number] Procedure:                Upper GI endoscopy Indications:              Dysphagia, Suspected gastro-esophageal reflux                            disease Medicines:                Monitored Anesthesia Care Procedure:                Pre-Anesthesia Assessment:                           - Prior to the procedure, a History and Physical                            was performed, and patient medications and                            allergies were reviewed. The patient's tolerance of                            previous anesthesia was also reviewed. The risks                            and benefits of the procedure and the sedation                            options and risks were discussed with the patient.                            All questions were answered, and informed consent                            was obtained. Prior Anticoagulants: The patient has                            taken no anticoagulant or antiplatelet agents. ASA                            Grade Assessment: II - A patient with mild systemic                            disease. After reviewing the risks and benefits,                            the patient was deemed in satisfactory condition to                            undergo the procedure.  After obtaining informed consent, the endoscope was                            passed under direct vision. Throughout the                            procedure, the patient's blood pressure, pulse, and                            oxygen saturations were monitored continuously. The                            Endoscope was introduced through the mouth, and                            advanced to the second part of duodenum. The  upper                            GI endoscopy was accomplished without difficulty.                            The patient tolerated the procedure well. Scope In: Scope Out: Findings:                 The examined portions of the nasopharynx,                            oropharynx and larynx were normal.                           One benign-appearing, intrinsic mild stenosis was                            found. This stenosis measured 1.3 cm (inner                            diameter) x 1 cm (in length). The stenosis was                            traversed. A TTS dilator was passed through the                            scope. Dilation with a 15-16.5-18 mm balloon                            dilator was performed to 18 mm. The dilation site                            was examined and showed moderate mucosal                            disruption. Estimated blood loss was minimal.  The exam of the esophagus was otherwise normal.                           Biopsies were obtained from the proximal and distal                            esophagus with cold forceps for histology of                            suspected eosinophilic esophagitis. Estimated blood                            loss was minimal.                           A 3 cm hiatal hernia was present.                           The stomach was normal.                           The examined duodenum was normal. Complications:            No immediate complications. Estimated Blood Loss:     Estimated blood loss was minimal. Impression:               - The examined portions of the nasopharynx,                            oropharynx and larynx were normal.                           - Benign-appearing esophageal stenosis. Dilated.                           - 3 cm hiatal hernia.                           - Normal stomach.                           - Normal examined duodenum.                           - Biopsies were  taken with a cold forceps for                            evaluation of eosinophilic esophagitis.                           - Patient's esophageal stenosis most likely                            secondary to GERD. Recommendation:           - Patient has a contact number available for  emergencies. The signs and symptoms of potential                            delayed complications were discussed with the                            patient. Return to normal activities tomorrow.                            Written discharge instructions were provided to the                            patient.                           - Follow post-dilation diet protocol, then resume                            previous diet.                           - Continue present medications.                           - Await pathology results.                           - Further recommendations to follow pathology                            results. Charles Bixby E. Candis Schatz, MD 10/21/2022 1:58:16 PM This report has been signed electronically.

## 2022-10-24 ENCOUNTER — Telehealth: Payer: Self-pay | Admitting: *Deleted

## 2022-10-24 NOTE — Telephone Encounter (Signed)
  Follow up Call-     10/21/2022   12:48 PM  Call back number  Post procedure Call Back phone  # [phone]  Permission to leave phone message Yes     Patient questions:  Do you have a fever, pain , or abdominal swelling? No. Pain Score  0 *  Have you tolerated food without any problems? Yes.    Have you been able to return to your normal activities? Yes.    Do you have any questions about your discharge instructions: Diet   No. Medications  No. Follow up visit  No.  Do you have questions or concerns about your Care? No.  Actions: * If pain score is 4 or above: No action needed, pain <4.

## 2022-10-27 ENCOUNTER — Encounter: Payer: Self-pay | Admitting: Gastroenterology

## 2022-10-30 NOTE — Progress Notes (Signed)
Dr. Dione Plover,  The biopsies of your esophagus showed elevated levels of eosinophils in the distal esophagus, but the eosinophil count in the proximal esophagus did not meet criteria for eosinophilic esophagitis.  Based on the endoscopic appearance of your esophagus and presence of a distal stricture which is commonly seen in reflux esophagitis, I suspect your symptoms are primarily related to acid reflux and a peptic stricture, rather than eosinophilic esophagitis.  Please continue to take pantoprazole daily.

## 2022-11-02 NOTE — Progress Notes (Signed)
Synopsis: Referred for subacute cough by Vivi Barrack, MD  Subjective:   PATIENT ID: Charles Gordon GENDER: male DOB: Feb 04, 1986, MRN: [id number]  Chief Complaint  Patient presents with   Follow-up    Review PFT today.  Wheeze x 2 weeks, more when laughing or at end of long exhalation.   37yM with history of DVT UE, CHEK2+   Had visit with PCP 10/4 at which had 1 month cough/wheeze along with persistent sinus congestion. Even woke wife up from sleep. Tried azithro - little improvement, albuterol - moderate improvement.   PCP prescribed levaquin and budesonide inhaler, had also just started ppi (has had dysphagia for last 8-9 months) and was referred to GI.   Never had asthma or eczema as a kid.   Within a week of starting flovent felt much better with less wheeze. Flovent is about $50 out of pocket and so now using just once daily if he can get away with it. Little to no cough. No limitation riding bike to the hospital now that he's on flovent.   He doesn't have sinus congestion now. Had previously used flonase on and off but now that he's using it more consistently less sinonasal congestion. He has noticed seasonal allergy in past but not formally tested.   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 No smoking - long time ago very limited. No vaping. No MJ in a long time. 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   Off of flovent for 4.5 weeks.   Overall doing better than at last visit. No wheezing recently. No cough.  Otherwise pertinent review of systems is negative.  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     Family History  Problem  Relation Age of Onset   Breast cancer Mother 50       CHEK2+   Colon cancer Maternal Grandmother 96   Prostate cancer Neg Hx      Past Surgical History:  Procedure Laterality Date   PILONIDAL CYST EXCISION  2003    Social History   Socioeconomic History   Marital status: Married    Spouse name: Not on file   Number of children: 3   Years of education: Not on file   Highest education level: Not on file  Occupational History   Not on file  Tobacco Use   Smoking status: Never   Smokeless tobacco: Never  Vaping Use   Vaping Use: Never used  Substance and Sexual Activity   Alcohol use: Never   Drug use: Never   Sexual activity: Not on file  Other Topics Concern   Not on file  Social History Narrative   Left Handed and uses right hand as well.    Lives in a one story home   Drinks caffeine    Social Determinants of Health   Financial Resource Strain: Not on file  Food Insecurity: Not on file  Transportation Needs: Not on file  Physical Activity: Not on file  Stress: Not on file  Social Connections: Not on file  Intimate Partner Violence: Not on file  Allergies  Allergen Reactions   Sulfa Antibiotics Swelling   Penicillins Rash     Outpatient Medications Prior to Visit  Medication Sig Dispense Refill   albuterol (VENTOLIN HFA) 108 (90 Base) MCG/ACT inhaler Inhale 2 puffs into the lungs every 6 (six) hours as needed for wheezing or shortness of breath. 6.7 g 0   fluticasone (FLONASE) 50 MCG/ACT nasal spray Place 2 sprays into both nostrils daily. 16 g 6   pantoprazole (PROTONIX) 40 MG tablet Take 1 tablet (40 mg total) by mouth daily. 90 tablet 3   sertraline (ZOLOFT) 100 MG tablet TAKE 1 TABLET BY MOUTH ONCE DAILY 90 tablet 3   fluticasone (FLOVENT HFA) 110 MCG/ACT inhaler Inhale 1 puff into the lungs in the morning and in the evening (Patient not taking: Reported on 11/03/2022) 12 g PRN   No facility-administered medications prior to visit.        Objective:   Physical Exam:  General appearance: 37 y.o., male, NAD, conversant  Eyes: anicteric sclerae; PERRL, tracking appropriately HENT: NCAT; MMM Neck: Trachea midline; no lymphadenopathy, no JVD Lungs: CTAB, no crackles, no wheeze, with normal respiratory effort CV: RRR, no murmur  Abdomen: Soft, non-tender; non-distended, BS present  Extremities: No peripheral edema, warm Skin: Normal turgor and texture; no rash Psych: Appropriate affect Neuro: Alert and oriented to person and place, no focal deficit     Vitals:   11/03/22 1455  BP: 128/74  Pulse: 88  Temp: 98.2 F (36.8 C)  TempSrc: Oral  SpO2: 98%  Weight: 203 lb (92.1 kg)  Height: '5\' 9"'$  (1.753 m)    98% on RA BMI Readings from Last 3 Encounters:  11/03/22 29.98 kg/m  10/21/22 29.83 kg/m  09/02/22 29.83 kg/m   Wt Readings from Last 3 Encounters:  11/03/22 203 lb (92.1 kg)  10/21/22 202 lb (91.6 kg)  09/02/22 202 lb (91.6 kg)     CBC    Component Value Date/Time   WBC 7.1 01/03/2020 1326   RBC 4.73 01/03/2020 1326   HGB 14.8 01/03/2020 1326   HCT 42.6 01/03/2020 1326   PLT 177.0 01/03/2020 1326   MCV 90.0 01/03/2020 1326   MCHC 34.7 01/03/2020 1326   RDW 13.4 01/03/2020 1326     Chest Imaging: CXR 07/20/22 with hyperinflation - 7 anterior ribs seen bl  Pulmonary Functions Testing Results:    Latest Ref Rng & Units 11/03/2022    1:55 PM  PFT Results  FVC-Pre L 5.16  P  FVC-Predicted Pre % 99  P  FVC-Post L 5.22  P  FVC-Predicted Post % 100  P  Pre FEV1/FVC % % 79  P  Post FEV1/FCV % % 80  P  FEV1-Pre L 4.08  P  FEV1-Predicted Pre % 97  P  FEV1-Post L 4.19  P  DLCO uncorrected ml/min/mmHg 30.61  P  DLCO UNC% % 99  P  DLCO corrected ml/min/mmHg 30.61  P  DLCO COR %Predicted % 99  P  DLVA Predicted % 98  P  TLC L 6.77  P  TLC % Predicted % 100  P  RV % Predicted % 87  P    P Preliminary result   Overall normal PFT, 2/6 FV loops with inspiratory flattening      Assessment & Plan:   # Subacute/chronic cough, wheeze, dyspnea  Suspect post viral reactive airways vs now quiescent sthma vs treated non-asthmatic eosinophilic bronchitis.  Plan: - if wheeze/cough come back despite management of your reflux then  give [place] a call to make an appointment so we can see you in clinic     Maryjane Hurter, MD [facility] 11/03/2022 3:14 PM

## 2022-11-03 ENCOUNTER — Ambulatory Visit: Payer: 59 | Admitting: Student

## 2022-11-03 ENCOUNTER — Encounter: Payer: Self-pay | Admitting: Student

## 2022-11-03 ENCOUNTER — Ambulatory Visit (INDEPENDENT_AMBULATORY_CARE_PROVIDER_SITE_OTHER): Payer: 59 | Admitting: Student

## 2022-11-03 VITALS — BP 128/74 | HR 88 | Temp 98.2°F | Ht 69.0 in | Wt 203.0 lb

## 2022-11-03 DIAGNOSIS — R053 Chronic cough: Secondary | ICD-10-CM | POA: Diagnosis not present

## 2022-11-03 LAB — PULMONARY FUNCTION TEST
DL/VA % pred: 98 %
DL/VA: 4.67 ml/min/mmHg/L
DLCO cor % pred: 99 %
DLCO cor: 30.61 ml/min/mmHg
DLCO unc % pred: 99 %
DLCO unc: 30.61 ml/min/mmHg
FEF 25-75 Post: 3.95 L/sec
FEF 25-75 Pre: 3.8 L/sec
FEF2575-%Change-Post: 3 %
FEF2575-%Pred-Post: 97 %
FEF2575-%Pred-Pre: 94 %
FEV1-%Change-Post: 2 %
FEV1-%Pred-Post: 100 %
FEV1-%Pred-Pre: 97 %
FEV1-Post: 4.19 L
FEV1-Pre: 4.08 L
FEV1FVC-%Change-Post: 1 %
FEV1FVC-%Pred-Pre: 98 %
FEV6-%Change-Post: 0 %
FEV6-%Pred-Post: 102 %
FEV6-%Pred-Pre: 101 %
FEV6-Post: 5.19 L
FEV6-Pre: 5.16 L
FEV6FVC-%Change-Post: 0 %
FEV6FVC-%Pred-Post: 101 %
FEV6FVC-%Pred-Pre: 102 %
FVC-%Change-Post: 1 %
FVC-%Pred-Post: 100 %
FVC-%Pred-Pre: 99 %
FVC-Post: 5.22 L
FVC-Pre: 5.16 L
Post FEV1/FVC ratio: 80 %
Post FEV6/FVC ratio: 99 %
Pre FEV1/FVC ratio: 79 %
Pre FEV6/FVC Ratio: 100 %
RV % pred: 87 %
RV: 1.5 L
TLC % pred: 100 %
TLC: 6.77 L

## 2022-11-03 NOTE — Patient Instructions (Signed)
Full PFT performed today.

## 2022-11-03 NOTE — Patient Instructions (Signed)
-  if wheeze/cough come back despite management of your reflux then give [place] a call to make an appointment so we can see you in clinic. [phone]. Rodman Pickle and Lenice Llamas have expertise in asthma and stuff on this spectrum.

## 2022-11-03 NOTE — Progress Notes (Signed)
Full PFT performed today.

## 2022-11-23 ENCOUNTER — Ambulatory Visit: Payer: No Typology Code available for payment source | Admitting: Dermatology

## 2022-12-04 ENCOUNTER — Encounter: Payer: Self-pay | Admitting: Family Medicine

## 2022-12-04 MED ORDER — DOXYCYCLINE HYCLATE 100 MG PO CAPS
100.0000 mg | ORAL_CAPSULE | Freq: Two times a day (BID) | ORAL | 0 refills | Status: DC
  Filled 2022-12-04: qty 14, 7d supply, fill #0

## 2022-12-04 MED ORDER — DOXYCYCLINE HYCLATE 100 MG PO CAPS: 100.0000 mg | ORAL_CAPSULE | Freq: Two times a day (BID) | ORAL | 0 refills | Status: DC

## 2022-12-05 ENCOUNTER — Other Ambulatory Visit: Payer: Self-pay

## 2023-02-06 ENCOUNTER — Other Ambulatory Visit (HOSPITAL_COMMUNITY): Payer: Self-pay

## 2023-02-06 ENCOUNTER — Ambulatory Visit (INDEPENDENT_AMBULATORY_CARE_PROVIDER_SITE_OTHER): Payer: 59 | Admitting: Family Medicine

## 2023-02-06 ENCOUNTER — Encounter: Payer: Self-pay | Admitting: Family Medicine

## 2023-02-06 VITALS — BP 102/68 | HR 71 | Temp 97.7°F | Ht 69.0 in | Wt 203.4 lb

## 2023-02-06 DIAGNOSIS — M25561 Pain in right knee: Secondary | ICD-10-CM

## 2023-02-06 DIAGNOSIS — B360 Pityriasis versicolor: Secondary | ICD-10-CM | POA: Diagnosis not present

## 2023-02-06 MED ORDER — FLUCONAZOLE 150 MG PO TABS
150.0000 mg | ORAL_TABLET | ORAL | 0 refills | Status: DC | PRN
  Filled 2023-02-06 – 2023-03-02 (×3): qty 2, 6d supply, fill #0

## 2023-02-06 MED ORDER — KETOCONAZOLE 2 % EX SHAM
1.0000 | MEDICATED_SHAMPOO | CUTANEOUS | 0 refills | Status: DC
  Filled 2023-02-06 – 2023-03-02 (×3): qty 120, 84d supply, fill #0

## 2023-02-06 MED ORDER — MELOXICAM 15 MG PO TABS
15.0000 mg | ORAL_TABLET | Freq: Every day | ORAL | 0 refills | Status: DC
  Filled 2023-02-06 – 2023-03-02 (×3): qty 30, 30d supply, fill #0

## 2023-02-06 NOTE — Patient Instructions (Signed)
It was very nice to see you today!  You had patellar tendinitis.  Work on the exercises.  Take meloxicam daily.  Use ice pack a few times daily as well.  I will refill your Diflucan and send in ketoconazole.  Let us know if not improving in a couple of weeks.  Take care, Dr [name]  PLEASE NOTE:  If you had any lab tests, please let us know if you have not heard back within a few days. You may see your results on mychart before we have a chance to review them but we will give you a call once they are reviewed by [place].   If we ordered any referrals today, please let us know if you have not heard from their office within the next week.   If you had any urgent prescriptions sent in today, please check with the pharmacy within an hour of our visit to make sure the prescription was transmitted appropriately.   Please try these tips to maintain a healthy lifestyle:  Eat at least 3 REAL meals and 1-2 snacks per day.  Aim for no more than 5 hours between eating.  If you eat breakfast, please do so within one hour of getting up.   Each meal should contain half fruits/vegetables, one quarter protein, and one quarter carbs (no bigger than a computer mouse)  Cut down on sweet beverages. This includes juice, soda, and sweet tea.   Drink at least 1 glass of water with each meal and aim for at least 8 glasses per day  Exercise at least 150 minutes every week.

## 2023-02-06 NOTE — Assessment & Plan Note (Signed)
Mild flare currently.  Will send in one-time dose of Diflucan.  He will also continue using his ketoconazole shampoo and we will refill today.

## 2023-02-06 NOTE — Progress Notes (Signed)
   Charles Gordon is a 37 y.o. male who presents today for an office visit.  Assessment/Plan:  New/Acute Problems: Right Knee Pain Possible patellar tendonitis.  We discussed home exercises and handout was given.  Start meloxicam 15 mg daily for the next 1 to 2 weeks as well.  Recommended ice and compression as well.  He will let me know if not improving in the next 1 to 2 weeks and we can refer to PT or sports medicine at that time.  Chronic Problems Addressed Today: Tinea versicolor Mild flare currently.  Will send in one-time dose of Diflucan.  He will also continue using his ketoconazole shampoo and we will refill today.     Subjective:  HPI:  Patient here with right knee pain.  Started several weeks ago.  He has been playing more soccer the last few months and think this may be the cause of symptoms.  Predominately located over lower part of his knee.  Worse with certain activities.  Worse after playing soccer or being active.  No specific treatments tried.       Objective:  Physical Exam: BP 102/68   Pulse 71   Temp 97.7 F (36.5 C) (Temporal)   Ht  (1.753 m)   Wt 203 lb 6.4 oz (92.3 kg)   SpO2 97%   BMI 30.04 kg/m   Gen: No acute distress, resting comfortably MSK:\ - Right Knee: No deformities tenderness to palpation along patellar tendon near insertion at tibial tuberosity.  Stable with anterior and posterior drawer signs.  Full range of motion throughout.  No crepitus. Neuro: Grossly normal, moves all extremities Psych: Normal affect and thought content      Temple Sporer M. [name], MD 02/06/2023 9:57 AM

## 2023-02-15 ENCOUNTER — Other Ambulatory Visit (HOSPITAL_COMMUNITY): Payer: Self-pay

## 2023-02-20 ENCOUNTER — Other Ambulatory Visit (HOSPITAL_COMMUNITY): Payer: Self-pay

## 2023-02-21 ENCOUNTER — Other Ambulatory Visit (HOSPITAL_COMMUNITY): Payer: Self-pay

## 2023-02-28 ENCOUNTER — Other Ambulatory Visit (HOSPITAL_COMMUNITY): Payer: Self-pay

## 2023-03-02 ENCOUNTER — Other Ambulatory Visit (HOSPITAL_COMMUNITY): Payer: Self-pay

## 2023-03-17 ENCOUNTER — Ambulatory Visit (INDEPENDENT_AMBULATORY_CARE_PROVIDER_SITE_OTHER): Payer: 59 | Admitting: Family Medicine

## 2023-03-17 ENCOUNTER — Encounter: Payer: Self-pay | Admitting: Family Medicine

## 2023-03-17 VITALS — BP 109/70 | HR 67 | Temp 97.8°F | Ht 69.0 in | Wt 204.4 lb

## 2023-03-17 DIAGNOSIS — K219 Gastro-esophageal reflux disease without esophagitis: Secondary | ICD-10-CM | POA: Diagnosis not present

## 2023-03-17 DIAGNOSIS — Z0001 Encounter for general adult medical examination with abnormal findings: Secondary | ICD-10-CM

## 2023-03-17 DIAGNOSIS — F419 Anxiety disorder, unspecified: Secondary | ICD-10-CM

## 2023-03-17 NOTE — Assessment & Plan Note (Signed)
Stable on Zoloft 100 mg daily. °

## 2023-03-17 NOTE — Progress Notes (Signed)
Chief Complaint:  Charles Gordon is a 37 y.o. male who presents today for his annual comprehensive physical exam.    Assessment/Plan:  New/Acute Problems: Knee Pain Improving since last visit.  He will continue current treatment plan and let us know if not improving.  Can refer to PT or sports medicine if needed.  Chronic Problems Addressed Today: Anxiety Stable on Zoloft 100 mg daily.  GERD (gastroesophageal reflux disease) Stable on Protonix 40 mg daily.  Preventative Healthcare: Deferred checking labs today.  Will check at age 61.  Due for colonoscopy age 73 due to Chek2 mutation.   Patient Counseling(The following topics were reviewed and/or handout was given):  -Nutrition: Stressed importance of moderation in sodium/caffeine intake, saturated fat and cholesterol, caloric balance, sufficient intake of fresh fruits, vegetables, and fiber.  -Stressed the importance of regular exercise.   -Substance Abuse: Discussed cessation/primary prevention of tobacco, alcohol, or other drug use; driving or other dangerous activities under the influence; availability of treatment for abuse.   -Injury prevention: Discussed safety belts, safety helmets, smoke detector, smoking near bedding or upholstery.   -Sexuality: Discussed sexually transmitted diseases, partner selection, use of condoms, avoidance of unintended pregnancy and contraceptive alternatives.   -Dental health: Discussed importance of regular tooth brushing, flossing, and dental visits.  -Health maintenance and immunizations reviewed. Please refer to Health maintenance section.  Return to care in 1 year for next preventative visit.     Subjective:  HPI:  He has no acute complaints today.   Lifestyle Diet: Balanced. Plenty of fruits and vegetables. Trying to cut down on snacking.  Exercise: Riding bike routinely.      03/17/2023    1:22 PM  Depression screen PHQ 2/9  Decreased Interest 0  Down, Depressed, Hopeless 0   PHQ - 2 Score 0    There are no preventive care reminders to display for this patient.   ROS: Per HPI, otherwise a complete review of systems was negative.   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 04/29/2022   Genetic testing 04/29/2022   GERD (gastroesophageal reflux disease) 03/11/2022   Tinea versicolor 03/11/2022   Anxiety 06/02/2020   Past Surgical History:  Procedure Laterality Date   PILONIDAL CYST EXCISION  2003    Family History  Problem Relation Age of Onset   Breast cancer Mother 70       CHEK2+   Colon cancer Maternal Grandmother 58   Prostate cancer Neg Hx     Medications- reviewed and updated Current Outpatient Medications  Medication Sig Dispense Refill   albuterol (VENTOLIN HFA) 108 (90 Base) MCG/ACT inhaler Inhale 2 puffs into the lungs every 6 (six) hours as needed for wheezing or shortness of breath. 6.7 g 0   fluticasone (FLONASE) 50 MCG/ACT nasal spray Place 2 sprays into both nostrils daily. 16 g 6   ketoconazole (NIZORAL) 2 % shampoo Apply topically 2 times a week. 120 mL 0   meloxicam (MOBIC) 15 MG tablet Take 1 tablet (15 mg) by mouth daily. 30 tablet 0   pantoprazole (PROTONIX) 40 MG tablet Take 1 tablet (40 mg total) by  mouth daily. 90 tablet 3   sertraline (ZOLOFT) 100 MG tablet TAKE 1 TABLET BY MOUTH ONCE DAILY 90 tablet 3   No current facility-administered medications for this visit.    Allergies-reviewed and updated Allergies  Allergen Reactions   Sulfa Antibiotics Swelling   Penicillins Rash    Social History   Socioeconomic History   Marital status: Married    Spouse name: Not on file    Number of children: 3   Years of education: Not on file   Highest education level: Not on file  Occupational History   Not on file  Tobacco Use   Smoking status: Never   Smokeless tobacco: Never  Vaping Use   Vaping Use: Never used  Substance and Sexual Activity   Alcohol use: Never   Drug use: Never   Sexual activity: Not on file  Other Topics Concern   Not on file  Social History Narrative   Left Handed and uses right hand as well.    Lives in a one story home   Drinks caffeine    Social Determinants of Health   Financial Resource Strain: Not on file  Food Insecurity: Not on file  Transportation Needs: Not on file  Physical Activity: Not on file  Stress: Not on file  Social Connections: Not on file        Objective:  Physical Exam: BP 109/70   Pulse 67   Temp 97.8 F (36.6 C) (Temporal)   Ht 5\' 9"  (1.753 m)   Wt 204 lb 6.4 oz (92.7 kg)   SpO2 99%   BMI 30.18 kg/m   Body mass index is 30.18 kg/m. Wt Readings from Last 3 Encounters:  03/17/23 204 lb 6.4 oz (92.7 kg)  02/06/23 203 lb 6.4 oz (92.3 kg)  11/03/22 203 lb (92.1 kg)   Gen: NAD, resting comfortably HEENT: TMs normal bilaterally. OP clear. No thyromegaly noted.  CV: RRR with no murmurs appreciated Pulm: NWOB, CTAB with no crackles, wheezes, or rhonchi GI: Normal bowel sounds present. Soft, Nontender, Nondistended. MSK: no edema, cyanosis, or clubbing noted Skin: warm, dry Neuro: CN2-12 grossly intact. Strength 5/5 in upper and lower extremities. Reflexes symmetric and intact bilaterally.  Psych: Normal affect and thought content     Neli Fofana M. [name], MD 03/17/2023 2:02 PM

## 2023-03-17 NOTE — Assessment & Plan Note (Signed)
Stable on Protonix 40 mg daily. 

## 2023-03-28 ENCOUNTER — Other Ambulatory Visit: Payer: Self-pay | Admitting: Family Medicine

## 2023-03-28 ENCOUNTER — Other Ambulatory Visit (HOSPITAL_COMMUNITY): Payer: Self-pay

## 2023-03-28 ENCOUNTER — Other Ambulatory Visit: Payer: Self-pay

## 2023-03-28 MED ORDER — PANTOPRAZOLE SODIUM 40 MG PO TBEC
40.0000 mg | DELAYED_RELEASE_TABLET | Freq: Every day | ORAL | 3 refills | Status: DC
  Filled 2023-03-28: qty 90, 90d supply, fill #0
  Filled 2023-06-26: qty 90, 90d supply, fill #1
  Filled 2023-09-21: qty 90, 90d supply, fill #2
  Filled 2023-12-25: qty 90, 90d supply, fill #3

## 2023-04-05 ENCOUNTER — Other Ambulatory Visit (HOSPITAL_COMMUNITY): Payer: Self-pay

## 2023-05-15 ENCOUNTER — Other Ambulatory Visit: Payer: Self-pay | Admitting: Family Medicine

## 2023-05-15 ENCOUNTER — Other Ambulatory Visit (HOSPITAL_COMMUNITY): Payer: Self-pay

## 2023-05-15 MED ORDER — TRIAMCINOLONE ACETONIDE 0.1 % EX OINT
1.0000 | TOPICAL_OINTMENT | Freq: Two times a day (BID) | CUTANEOUS | 0 refills | Status: DC
  Filled 2023-05-15: qty 30, 15d supply, fill #0

## 2023-05-15 MED ORDER — PREDNISONE 20 MG PO TABS
40.0000 mg | ORAL_TABLET | Freq: Every day | ORAL | 0 refills | Status: DC
  Filled 2023-05-15: qty 8, 4d supply, fill #0
  Filled 2023-05-15: qty 2, 1d supply, fill #0

## 2023-05-16 ENCOUNTER — Other Ambulatory Visit (HOSPITAL_COMMUNITY): Payer: Self-pay

## 2023-06-25 ENCOUNTER — Encounter: Payer: Self-pay | Admitting: Family Medicine

## 2023-06-26 ENCOUNTER — Other Ambulatory Visit: Payer: Self-pay | Admitting: Family Medicine

## 2023-06-26 ENCOUNTER — Other Ambulatory Visit: Payer: Self-pay | Admitting: *Deleted

## 2023-06-26 DIAGNOSIS — M25562 Pain in left knee: Secondary | ICD-10-CM

## 2023-06-27 ENCOUNTER — Other Ambulatory Visit (HOSPITAL_COMMUNITY): Payer: Self-pay

## 2023-06-27 ENCOUNTER — Other Ambulatory Visit: Payer: Self-pay

## 2023-06-27 MED ORDER — SERTRALINE HCL 100 MG PO TABS
100.0000 mg | ORAL_TABLET | Freq: Every day | ORAL | 1 refills | Status: DC
  Filled 2023-06-27: qty 90, 90d supply, fill #0
  Filled 2023-09-21: qty 90, 90d supply, fill #1

## 2023-06-29 NOTE — Progress Notes (Signed)
Charles Gordon D.[facility] [address] [place] [address] Phone: [phone]   Assessment and Plan:     1. Chronic pain of both knees 2. Patellar tendinitis of both knees  -Chronic with exacerbation, initial sports medicine visit - Most consistent with patellar tendinitis of bilateral knees based on HPI, physical exam, x-ray imaging - X-rays obtained in clinic.  My interpretation: No acute fracture or dislocation.  Unremarkable imaging - Recommend using patellar knee straps when physically active - Start HEP and physical therapy for patellar tendinitis - Start meloxicam 15 mg daily x3 weeks.   Do not to use additional NSAIDs while taking meloxicam.  May use Tylenol [phone] mg 2 to 3 times a day for breakthrough pain.   Pertinent previous records reviewed include family medicine visit 02/06/2023   Follow Up: 4 weeks for reevaluation.  If no improvement or worsening of symptoms, could consider ECSWT versus PRP versus ultrasound   Subjective:   I, Charles Gordon, am serving as a Neurosurgeon for Doctor Richardean Sale  Chief Complaint: bilat knee pain   HPI:   06/30/2023 Patient is a 37 year old male complaining of bilat knee pain. Patient states patellar tendon pain for a couple of months. He feels the pain a lot more after playing soccer. Pain with walking up and down steps. Intermittent tylenol and ibu. Pain resolves when he rest. No radiating pain , no numbness or tingling.   Relevant Historical Information: GERD  Additional pertinent review of systems negative.   Current Outpatient Medications:    fluticasone (FLONASE) 50 MCG/ACT nasal spray, Place 2 sprays into both nostrils daily., Disp: 16 g, Rfl: 6   ketoconazole (NIZORAL) 2 % shampoo, Apply topically 2 times a week., Disp: 120 mL, Rfl: 0   meloxicam (MOBIC) 15 MG tablet, Take 1 tablet (15 mg total) by mouth daily., Disp: 30 tablet, Rfl: 0   pantoprazole (PROTONIX) 40 MG tablet,  Take 1 tablet (40 mg total) by mouth daily., Disp: 90 tablet, Rfl: 3   sertraline (ZOLOFT) 100 MG tablet, Take 1 tablet (100 mg total) by mouth daily., Disp: 90 tablet, Rfl: 1   triamcinolone ointment (KENALOG) 0.1 %, Apply topically to affected area 2 (two) times daily., Disp: 30 g, Rfl: 0   albuterol (VENTOLIN HFA) 108 (90 Base) MCG/ACT inhaler, Inhale 2 puffs into the lungs every 6 (six) hours as needed for wheezing or shortness of breath., Disp: 6.7 g, Rfl: 0   meloxicam (MOBIC) 15 MG tablet, Take 1 tablet (15 mg) by mouth daily., Disp: 30 tablet, Rfl: 0   predniSONE (DELTASONE) 20 MG tablet, Take 2 tablets (40 mg total) by mouth daily for 5 days., Disp: 10 tablet, Rfl: 0   Objective:     Vitals:   06/30/23 1309  BP: 120/84  Pulse: 62  SpO2: 98%  Weight: 204 lb (92.5 kg)  Height: 5\' 9"  (1.753 m)      Body mass index is 30.13 kg/m.    Physical Exam:    General:  awake, alert oriented, no acute distress nontoxic Skin: no suspicious lesions or rashes Neuro:sensation intact and strength 5/5 with no deficits, no atrophy, normal muscle tone Psych: No signs of anxiety, depression or other mood disorder  Bilateral knee: No swelling No deformity Neg fluid wave, joint milking ROM Flex 110, Ext 0 TTP tibial tuberosity NTTP over the quad tendon, medial fem condyle, lat fem condyle, patella, plica, patella tendon,   fibular head, posterior fossa,  pes anserine bursa, gerdy's tubercle, medial jt line, lateral jt line Neg anterior and posterior drawer Neg lachman Neg sag sign Negative varus stress Negative valgus stress    Gait normal    Electronically signed by:  Charles Gordon [facility] 1:28 PM 06/30/23

## 2023-06-30 ENCOUNTER — Other Ambulatory Visit (HOSPITAL_COMMUNITY): Payer: Self-pay

## 2023-06-30 ENCOUNTER — Ambulatory Visit (INDEPENDENT_AMBULATORY_CARE_PROVIDER_SITE_OTHER): Payer: 59

## 2023-06-30 ENCOUNTER — Ambulatory Visit: Payer: 59 | Admitting: Sports Medicine

## 2023-06-30 VITALS — BP 120/84 | HR 62 | Ht 69.0 in | Wt 204.0 lb

## 2023-06-30 DIAGNOSIS — M25562 Pain in left knee: Secondary | ICD-10-CM | POA: Diagnosis not present

## 2023-06-30 DIAGNOSIS — G8929 Other chronic pain: Secondary | ICD-10-CM | POA: Diagnosis not present

## 2023-06-30 DIAGNOSIS — M7651 Patellar tendinitis, right knee: Secondary | ICD-10-CM

## 2023-06-30 DIAGNOSIS — M25561 Pain in right knee: Secondary | ICD-10-CM | POA: Diagnosis not present

## 2023-06-30 DIAGNOSIS — M7652 Patellar tendinitis, left knee: Secondary | ICD-10-CM

## 2023-06-30 MED ORDER — MELOXICAM 15 MG PO TABS
15.0000 mg | ORAL_TABLET | Freq: Every day | ORAL | 0 refills | Status: DC
  Filled 2023-06-30: qty 30, 30d supply, fill #0

## 2023-06-30 NOTE — Patient Instructions (Signed)
-   Start meloxicam 15 mg daily x3  weeks. May use remaining meloxicam as needed once daily for pain control.  Do not to use additional NSAIDs while taking meloxicam.  May use Tylenol [phone] mg 2 to 3 times a day for breakthrough pain. Patellar knee straps Knee HEP Pt referral  2 week follow up

## 2023-07-12 ENCOUNTER — Encounter: Payer: Self-pay | Admitting: Family Medicine

## 2023-07-13 ENCOUNTER — Other Ambulatory Visit: Payer: Self-pay | Admitting: *Deleted

## 2023-07-13 DIAGNOSIS — H539 Unspecified visual disturbance: Secondary | ICD-10-CM

## 2023-07-13 NOTE — Telephone Encounter (Signed)
Referral placed.

## 2023-07-13 NOTE — Telephone Encounter (Signed)
Please advise 

## 2023-07-13 NOTE — Telephone Encounter (Signed)
Ok to place referral.  Charles Gordon. [name], MD 07/13/2023 2:35 PM

## 2023-07-21 ENCOUNTER — Ambulatory Visit: Payer: 59 | Admitting: Sports Medicine

## 2023-07-24 ENCOUNTER — Ambulatory Visit: Payer: 59 | Attending: Sports Medicine | Admitting: Physical Therapy

## 2023-07-24 ENCOUNTER — Encounter: Payer: Self-pay | Admitting: Physical Therapy

## 2023-07-24 ENCOUNTER — Other Ambulatory Visit: Payer: Self-pay

## 2023-07-24 DIAGNOSIS — M25561 Pain in right knee: Secondary | ICD-10-CM | POA: Diagnosis not present

## 2023-07-24 DIAGNOSIS — M7651 Patellar tendinitis, right knee: Secondary | ICD-10-CM | POA: Insufficient documentation

## 2023-07-24 DIAGNOSIS — M6281 Muscle weakness (generalized): Secondary | ICD-10-CM | POA: Insufficient documentation

## 2023-07-24 DIAGNOSIS — M7652 Patellar tendinitis, left knee: Secondary | ICD-10-CM | POA: Diagnosis not present

## 2023-07-24 DIAGNOSIS — G8929 Other chronic pain: Secondary | ICD-10-CM | POA: Diagnosis not present

## 2023-07-24 DIAGNOSIS — M25562 Pain in left knee: Secondary | ICD-10-CM | POA: Diagnosis not present

## 2023-07-24 NOTE — Patient Instructions (Signed)
Access Code: [email or web address] URL: [email or web address] Date: 07/24/2023 Prepared by: Rosana Hoes  Exercises - Clam with Resistance  - 1 x daily - 3 x weekly - 3 sets - 15 reps - Bridge with Resistance  - 1 x daily - 3 x weekly - 3 sets - 10 reps - Wall Quarter Squat  - 1 x daily - 4-5 x weekly - 5 reps - 45 seconds hold - Single Leg Lunge with Foot on Bench  - 1 x daily - 4-5 x weekly - 3 sets - 10 reps

## 2023-07-24 NOTE — Therapy (Addendum)
 OUTPATIENT PHYSICAL THERAPY EVALUATION  DISCHARGE   Patient Name: Charles Gordon MRN: [id number] DOB:08-30-1986, 37 y.o., male Today's Date: 07/24/2023   END OF SESSION:  PT End of Session - 07/24/23 0859     Visit Number 1    Number of Visits 9    Date for PT Re-Evaluation 09/18/23    Authorization Type [facility] [employer]    PT Start Time 0800    PT Stop Time 0845    PT Time Calculation (min) 45 min    Activity Tolerance Patient tolerated treatment well    Behavior During Therapy [facility] for tasks assessed/performed             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   Past Surgical History:  Procedure Laterality Date   PILONIDAL CYST EXCISION  2003   Patient Active Problem List   Diagnosis Date Noted   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 04/29/2022   Genetic testing 04/29/2022   GERD (gastroesophageal reflux disease) 03/11/2022   Tinea versicolor 03/11/2022   Anxiety 06/02/2020    PCP: Ardith Dark, MD  REFERRING PROVIDER: Richardean Sale, DO  REFERRING DIAG: Chronic pain of both knees  THERAPY DIAG:  Chronic pain of both knees  Muscle weakness (generalized)  Rationale for Evaluation and Treatment: Rehabilitation  ONSET DATE: Ongoing for months   SUBJECTIVE:  SUBJECTIVE STATEMENT: Patient reports anterior knee pain just on the tibia, started noticing it when his son's soccer increased and he was kicking a ball more which caused knee pain. The knee pain worsened over the past few months. He does report when it flares up he does have trouble going up and down step. He does ride his bike and that does not give him too much trouble other than if he is going up hill. He was provided straps.   PERTINENT HISTORY: None  PAIN:  Are you having pain? Yes:  NPRS scale: 0/10 at rest,  at worst 4/10 Pain location: Bilateral knee  Pain description: Sharp Aggravating factors: Activity, stairs, walking Relieving factors: Rest, medication  PRECAUTIONS: None  RED FLAGS: None   WEIGHT BEARING RESTRICTIONS: No  FALLS:  Has patient fallen in last 6 months? No  PLOF: Independent  PATIENT GOALS: Ability to play soccer or be active without pain or limitation   OBJECTIVE:  Note: Objective measures were completed at Evaluation unless otherwise noted. PATIENT SURVEYS:  FOTO 61% functional status  COGNITION: Overall cognitive status: Within functional limits for tasks assessed     SENSATION: WFL  EDEMA:  None  MUSCLE LENGTH: Limitations noted bilateral hamstring and hip flexor, quad and calf flexibility WFL  POSTURE:   WFL  PALPATION: Tender to palpation tibial tuberosity  LOWER EXTREMITY ROM:   LE ROM grossly WFL, slight limitation left hip IR compared to right  LOWER EXTREMITY MMT:  MMT Right eval Left eval  Hip flexion    Hip extension 4 4  Hip abduction 4 4  Hip adduction    Hip internal rotation    Hip external rotation    Knee flexion 5 5  Knee extension 5 5  Ankle dorsiflexion    Ankle plantarflexion    Ankle inversion    Ankle eversion     (Blank rows = not tested)  LOWER EXTREMITY  SPECIAL TESTS:  Not assessed  FUNCTIONAL TESTS:  SL squat:  Right: excessive anterior knee translation, contralateral hip drop Left: excessive anterior knee translation, dynamic knee valgus  GAIT: Assistive device utilized: None Level of assistance: Complete Independence Comments: WFL   TODAY'S TREATMENT:    [facility] Adult PT Treatment:                                                DATE: 07/24/2023 Therapeutic Exercise: Clamshell with blue x 15 each Bridge with blue x 10 Wall sit 2 x 45 sec Rear foot elevated split squat x 10 each - bolster placed at front toe to avoid excessive anterior knee translation  PATIENT EDUCATION:  Education details:  Exam findings, POC, HEP Person educated: Patient Education method: Explanation, Demonstration, Tactile cues, Verbal cues, and Handouts Education comprehension: verbalized understanding, returned demonstration, verbal cues required, tactile cues required, and needs further education  HOME EXERCISE PROGRAM: Access Code: [email or web address]    ASSESSMENT: CLINICAL IMPRESSION: Patient is a 37 y.o. male who was seen today for physical therapy evaluation and treatment for chronic bilateral knee pain. 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 He does exhibit increased knee pain with squatting tasks, especially with excessive anterior knee translation, and gross hip strength deficit. He was provided exercises consisting of patellar tendon isometrics, SL loading, and hip strengthening.   OBJECTIVE IMPAIRMENTS: decreased activity tolerance, decreased strength, impaired flexibility, improper body mechanics, and pain.   ACTIVITY LIMITATIONS: stairs and locomotion level  PARTICIPATION LIMITATIONS: community activity  PERSONAL FACTORS: Fitness and Time since onset of injury/illness/exacerbation are also affecting patient's functional outcome.   REHAB POTENTIAL: Good  CLINICAL DECISION MAKING: Stable/uncomplicated  EVALUATION COMPLEXITY: Low   GOALS: Goals reviewed with patient? Yes  SHORT TERM GOALS: Target date: 08/21/2023  Patient will be I with initial HEP in order to progress with therapy. Baseline: HEP provided at eval Goal status: INITIAL  2.  Patient will demonstrate proper SL squat mechanics and without an increase in knee pain Baseline: see limitations above Goal status: INITIAL  LONG TERM GOALS: Target date: 09/18/2023  Patient will be I with final HEP to maintain progress from PT. Baseline: HEP provided at eval Goal status: INITIAL  2.  Patient will report >/= 78% status on FOTO to indicate improved functional ability. Baseline: 61% functional status Goal  status: INITIAL  3.  Patient will demonstrate gross hip strength >/= 4+/5 MMT in order to improve knee and pelvic control with dynamic tasks Baseline: grossly 4/5 MMT Goal status: INITIAL  4.  Patient will report </= 1/10 knee pain with soccer related tasks in order to reduce functional limitations Baseline: 4/10 pain Goal status: INITIAL   PLAN: PT FREQUENCY: 1x/week  PT DURATION: 8 weeks  PLANNED INTERVENTIONS: Therapeutic exercises, Therapeutic activity, Neuromuscular re-education, Balance training, Gait training, Patient/Family education, Self Care, Joint mobilization, Aquatic Therapy, Dry Needling, Electrical stimulation, Cryotherapy, Moist heat, Taping, Ionotophoresis 4mg /ml Dexamethasone, Manual therapy, and Re-evaluation  PLAN FOR NEXT SESSION: Review HEP and progress PRN, patellar tendon isometrics and progress loading as tolerated, squat, progress hip strengthening   Rosana Hoes, PT, DPT, LAT, ATC 07/24/23  12:51 PM Phone: [phone] Fax: [phone]  PHYSICAL THERAPY DISCHARGE SUMMARY  Visits from Start of Care: 1  Current functional level related to goals / functional outcomes: See above   Remaining deficits: See above  Education / Equipment: HEP   Patient agrees to discharge. Patient goals were not met. Patient is being discharged due to not returning since the last visit.  Rosana Hoes, PT, DPT, LAT, ATC 12/27/23  3:38 PM Phone: [phone] Fax: [phone]

## 2023-08-01 NOTE — Progress Notes (Signed)
    Aleen Sells D.[facility] [address] [place] [address] Phone: [phone]   Assessment and Plan:     1. Chronic pain of both knees 2. Patellar tendinitis of both knees -Chronic with exacerbation, subsequent visit - Still consistent with bilateral patellar tendinitis, worse on right.  Patient has had mild improvement with intermittent HEP and physical therapy, 3-week course of meloxicam, though he continues to have frequent pain worsened with activity - Recommend increasing frequency of HEP and physical therapy - Discontinue meloxicam and may use remainder as needed - Recommend Tylenol for day-to-day pain relief    Pertinent previous records reviewed include none   Follow Up: Patient can call in 6 to 8 weeks if no improvement and we could consider ECSWT.  Also discussed PRP injections, though we would start with ECSWT   Subjective:   I, Jerene Canny, am serving as a Neurosurgeon for Doctor Richardean Sale   Chief Complaint: bilat knee pain    HPI:    06/30/2023 Patient is a 37 year old male complaining of bilat knee pain. Patient states patellar tendon pain for a couple of months. He feels the pain a lot more after playing soccer. Pain with walking up and down steps. Intermittent tylenol and ibu. Pain resolves when he rest. No radiating pain , no numbness or tingling.   08/14/2023 Patient states that he still has pain. Meloxicam helped, took 3 week course    Relevant Historical Information: GERD  Additional pertinent review of systems negative.   Current Outpatient Medications:    fluticasone (FLONASE) 50 MCG/ACT nasal spray, Place 2 sprays into both nostrils daily., Disp: 16 g, Rfl: 6   ketoconazole (NIZORAL) 2 % shampoo, Apply topically 2 times a week., Disp: 120 mL, Rfl: 0   meloxicam (MOBIC) 15 MG tablet, Take 1 tablet (15 mg total) by mouth daily., Disp: 30 tablet, Rfl: 0   pantoprazole (PROTONIX) 40 MG tablet, Take 1 tablet  (40 mg total) by mouth daily., Disp: 90 tablet, Rfl: 3   sertraline (ZOLOFT) 100 MG tablet, Take 1 tablet (100 mg total) by mouth daily., Disp: 90 tablet, Rfl: 1   triamcinolone ointment (KENALOG) 0.1 %, Apply topically to affected area 2 (two) times daily., Disp: 30 g, Rfl: 0   Objective:     Vitals:   08/14/23 0815  BP: 100/80  Pulse: 87  SpO2: 95%  Weight: 205 lb (93 kg)  Height: 5\' 9"  (1.753 m)      Body mass index is 30.27 kg/m.    Physical Exam:    General:  awake, alert oriented, no acute distress nontoxic Skin: no suspicious lesions or rashes Neuro:sensation intact and strength 5/5 with no deficits, no atrophy, normal muscle tone Psych: No signs of anxiety, depression or other mood disorder   Bilateral knee: No swelling No deformity Neg fluid wave, joint milking ROM Flex 110, Ext 0 TTP mild tibial tuberosity, patella tendon,   NTTP over the quad tendon, medial fem condyle, lat fem condyle, patella, plica,  fibular head, posterior fossa, pes anserine bursa, gerdy's tubercle, medial jt line, lateral jt line Neg anterior and posterior drawer Neg lachman Neg sag sign Negative varus stress Negative valgus stress     Gait normal     Electronically signed by:  Aleen Sells [facility] 8:30 AM 08/14/23

## 2023-08-14 ENCOUNTER — Ambulatory Visit: Payer: 59

## 2023-08-14 ENCOUNTER — Ambulatory Visit: Payer: 59 | Admitting: Sports Medicine

## 2023-08-14 VITALS — BP 100/80 | HR 87 | Ht 69.0 in | Wt 205.0 lb

## 2023-08-14 DIAGNOSIS — M7651 Patellar tendinitis, right knee: Secondary | ICD-10-CM | POA: Diagnosis not present

## 2023-08-14 DIAGNOSIS — G8929 Other chronic pain: Secondary | ICD-10-CM

## 2023-08-14 DIAGNOSIS — M25562 Pain in left knee: Secondary | ICD-10-CM | POA: Diagnosis not present

## 2023-08-14 DIAGNOSIS — M25561 Pain in right knee: Secondary | ICD-10-CM

## 2023-08-14 DIAGNOSIS — M7652 Patellar tendinitis, left knee: Secondary | ICD-10-CM

## 2023-08-14 NOTE — Patient Instructions (Signed)
Discontinue daily meloxicam , use remainder as needed for breakthrough pain  Tylenol for day to day pain  Voltaren gel 1-2 times per day over areas of pain  Recommend calling to schedule regular PT appointments Call us in 6-8 weeks if no improvement and we can consider shockwave therapy

## 2023-08-21 ENCOUNTER — Ambulatory Visit: Payer: 59 | Admitting: Physical Therapy

## 2023-09-21 ENCOUNTER — Other Ambulatory Visit (HOSPITAL_COMMUNITY): Payer: Self-pay

## 2023-12-18 ENCOUNTER — Encounter: Payer: Self-pay | Admitting: Family Medicine

## 2023-12-18 NOTE — Telephone Encounter (Signed)
**Note De-identified  [place] Obfuscation** Please advise 

## 2023-12-19 NOTE — Telephone Encounter (Signed)
 I think cutting to 50 mg daily is a reasonable idea.  Please send a new prescription for 50 mg daily and have him follow-up with [place] in a few weeks.

## 2023-12-20 ENCOUNTER — Other Ambulatory Visit: Payer: Self-pay | Admitting: *Deleted

## 2023-12-20 MED ORDER — SERTRALINE HCL 50 MG PO TABS: 50.0000 mg | ORAL_TABLET | Freq: Every day | ORAL | 3 refills | Status: DC

## 2023-12-21 ENCOUNTER — Other Ambulatory Visit: Payer: Self-pay

## 2023-12-21 ENCOUNTER — Other Ambulatory Visit (HOSPITAL_COMMUNITY): Payer: Self-pay

## 2023-12-21 DIAGNOSIS — F419 Anxiety disorder, unspecified: Secondary | ICD-10-CM

## 2023-12-21 MED ORDER — SERTRALINE HCL 50 MG PO TABS
50.0000 mg | ORAL_TABLET | Freq: Every day | ORAL | 3 refills | Status: DC
Start: 2023-12-21 — End: 2024-07-15
  Filled 2023-12-21 – 2024-02-26 (×3): qty 30, 30d supply, fill #0

## 2023-12-25 ENCOUNTER — Other Ambulatory Visit: Payer: Self-pay

## 2023-12-25 ENCOUNTER — Other Ambulatory Visit (HOSPITAL_COMMUNITY): Payer: Self-pay

## 2023-12-26 ENCOUNTER — Other Ambulatory Visit: Payer: Self-pay

## 2024-01-16 ENCOUNTER — Encounter: Payer: 59 | Admitting: Internal Medicine

## 2024-02-26 ENCOUNTER — Other Ambulatory Visit (HOSPITAL_COMMUNITY): Payer: Self-pay

## 2024-03-22 ENCOUNTER — Other Ambulatory Visit (HOSPITAL_COMMUNITY): Payer: Self-pay

## 2024-03-22 ENCOUNTER — Other Ambulatory Visit: Payer: Self-pay | Admitting: Family Medicine

## 2024-03-22 ENCOUNTER — Other Ambulatory Visit: Payer: Self-pay

## 2024-03-22 MED ORDER — PANTOPRAZOLE SODIUM 40 MG PO TBEC
40.0000 mg | DELAYED_RELEASE_TABLET | Freq: Every day | ORAL | 0 refills | Status: DC
  Filled 2024-03-22: qty 90, 90d supply, fill #0

## 2024-03-25 ENCOUNTER — Encounter: Payer: Self-pay | Admitting: Internal Medicine

## 2024-03-25 ENCOUNTER — Other Ambulatory Visit: Payer: Self-pay

## 2024-03-25 ENCOUNTER — Other Ambulatory Visit (HOSPITAL_COMMUNITY): Payer: Self-pay

## 2024-03-25 ENCOUNTER — Ambulatory Visit (INDEPENDENT_AMBULATORY_CARE_PROVIDER_SITE_OTHER): Admitting: Internal Medicine

## 2024-03-25 VITALS — BP 114/82 | HR 74 | Ht 69.0 in | Wt 211.6 lb

## 2024-03-25 DIAGNOSIS — K2100 Gastro-esophageal reflux disease with esophagitis, without bleeding: Secondary | ICD-10-CM | POA: Diagnosis not present

## 2024-03-25 DIAGNOSIS — J4520 Mild intermittent asthma, uncomplicated: Secondary | ICD-10-CM

## 2024-03-25 DIAGNOSIS — J302 Other seasonal allergic rhinitis: Secondary | ICD-10-CM

## 2024-03-25 MED ORDER — FLUTICASONE PROPIONATE HFA 110 MCG/ACT IN AERO
1.0000 | INHALATION_SPRAY | Freq: Two times a day (BID) | RESPIRATORY_TRACT | 12 refills | Status: AC
  Filled 2024-03-25: qty 12, 30d supply, fill #0

## 2024-03-25 NOTE — Progress Notes (Signed)
 Charles Gordon    [id number]    April 02, 1986  Primary Care Physician:Parker, Jinny Mounts, MD Date of Appointment: 03/25/2024 Established Patient Visit  Chief complaint:   Chief Complaint  Patient presents with   Consult    Cough and wheezing. Wheezing started in the winter and lasted for 3 months. Pt states he feels better today.     HPI: Charles Gordon is a 38 y.o. man with past medical history mild intermittent asthma. Formerly seen by Dr. Jenny Gordon. History of GERD with esophageal stricture s/p dilation with eosinophils noted on biopsy in 2024.  Passive smoke exposure in childhood. Mild cigarette smoking <10 pack years in adolesence. History of thoracic outlet syndrome as a teenager with upper extremity DVT s/p catheter directed thrombolysis with warfarin for 4 months.   Interval Updates: Here for follow up due to recurrent symptoms of wheezing.   Was on flovent  inhaler with improvement of symptoms previously. A few months ago had symptoms of wheezing, coughing, chest tightness, nighttime symptoms. He didn't take anything for two months and then resumed the flovent  and symptoms improved.  GERD controlled on PPI - feels weight gain and hiatal hernia are contributing.   Does have seasonal allergies and takes claritin daily,   I have reviewed the patient's family social and past medical history and updated as appropriate.   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    Past Surgical History:  Procedure Laterality Date   PILONIDAL CYST EXCISION  2003    Family History  Problem Relation Age of Onset   Breast cancer Mother 2       CHEK2+   COPD Mother    Colon cancer Maternal Grandmother 55   Prostate cancer Neg Hx     Social History   Occupational History   Not on file  Tobacco Use   Smoking  status: Never   Smokeless tobacco: Never  Vaping Use   Vaping status: Never Used  Substance and Sexual Activity   Alcohol use: Never   Drug use: Never   Sexual activity: Not on file     Physical Exam: Blood pressure 114/82, pulse 74, height 5\' 9"  (1.753 m), weight 211 lb 9.6 oz (96 kg), SpO2 97%.  Gen:      No acute distress ENT: no nasal polyps, mucus membranes moist Lungs:    No increased respiratory effort, symmetric chest wall excursion, clear to auscultation bilaterally, no wheezes or crackles CV:         Regular rate and rhythm; no murmurs, rubs, or gallops.  No pedal edema   Data Reviewed: Imaging: I have personally reviewed the chest xray 2023 - mild hyperinflation  PFTs:     Latest Ref Rng & Units 11/03/2022    1:55 PM  PFT Results  FVC-Pre L 5.16   FVC-Predicted Pre % 99   FVC-Post L 5.22   FVC-Predicted Post % 100   Pre FEV1/FVC % % 79   Post FEV1/FCV % % 80   FEV1-Pre L 4.08   FEV1-Predicted Pre % 97   FEV1-Post L 4.19   DLCO uncorrected ml/min/mmHg 30.61   DLCO UNC% % 99   DLCO corrected ml/min/mmHg 30.61   DLCO COR %Predicted % 99   DLVA Predicted % 98  TLC L 6.77   TLC % Predicted % 100   RV % Predicted % 87    I have personally reviewed the patient's PFTs and normal pulmonary function.   Labs: Lab Results  Component Value Date   WBC 7.1 01/03/2020   HGB 14.8 01/03/2020   HCT 42.6 01/03/2020   MCV 90.0 01/03/2020   PLT 177.0 01/03/2020   Lab Results  Component Value Date   NA 137 01/03/2020   K 3.9 01/03/2020   CO2 30 01/03/2020   GLUCOSE 100 (H) 01/03/2020   BUN 13 01/03/2020   CREATININE 0.88 01/03/2020   CALCIUM 9.4 01/03/2020   GFR 98.64 01/03/2020    Immunization status: Immunization History  Administered Date(s) Administered   Influenza Whole 07/17/2022   Influenza,inj,Quad PF,6+ Mos 07/25/2019, 06/25/2020   Influenza,inj,quad, With Preservative 07/27/2018   PFIZER(Purple Top)SARS-COV-2 Vaccination 10/09/2019,  10/30/2019, 07/31/2020   Pfizer Covid-19 Vaccine Bivalent Booster 86yrs & up 08/06/2021   Tdap 05/03/2016, 05/14/2020   Unspecified SARS-COV-2 Vaccination 08/27/2022    External Records Personally Reviewed: pulmonary  Assessment:  Mild intermittent asthma, controlled GERD, controlled on PPI with h/o esophageal stricture and eosinophilic infiltration Seasonal allergic rhinitis, controlled   Plan/Recommendations:  Start Intermittent ICS with flovent  110  Continue claritin for allergies Continue PPI for GERD, Discussed lifestyle modifications as well as concern for EOE if recurrence.     Return to Care: Return in about 1 year (around 03/25/2025).   Charles Rover, MD Pulmonary and Critical Care Medicine [facility] [place] [facility] Office:[phone]

## 2024-03-25 NOTE — Patient Instructions (Signed)
 It was a pleasure to see you today!  Please schedule follow up with myself in 1 year.  If my schedule is not open yet, we will contact you with a reminder closer to that time. Please call [phone] if you haven't heard from us  a month before, and always call us  sooner if issues or concerns arise. You can also send us  a message through MyChart, but but aware that this is not to be used for urgent issues and it may take up to 5-7 days to receive a reply. Please be aware that you will likely be able to view your results before I have a chance to respond to them. Please give us  5 business days to respond to any non-urgent results.    We discussed continuing flovent  for your asthma. Because you do not have daily symptoms, using this inhaler on an as needed basis may be sufficient.   I recommend starting to use the inhaler daily as prescribed if you have symptoms of asthma such as chest tightness, wheezing, coughing, shortness of breath. You should also start using it if you have exposure to a sick contact, worsening allergies, or any other trigger for your asthma. I recommend you keep using it even after your respiratory symptoms resolve for 3-4 days. The goal of this therapy is to prevent your symptoms from becoming a flare severe enough to require steroids like prednisone .   Please call our office if using this inhaler on an as needed basis is not sufficient. You might need to be seen sooner than our scheduled follow up.   What is GERD? Gastroesophageal reflux disease (GERD) is gastroesophageal reflux diseasewhich occurs when the lower esophageal sphincter (LES) opens spontaneously, for varying periods of time, or does not close properly and stomach contents rise up into the esophagus. GER is also called acid reflux or acid regurgitation, because digestive juices--called acids--rise up with the food. The esophagus is the tube that carries food from the mouth to the stomach. The LES is a ring of muscle  at the bottom of the esophagus that acts like a valve between the esophagus and stomach.  When acid reflux occurs, food or fluid can be tasted in the back of the mouth. When refluxed stomach acid touches the lining of the esophagus it may cause a burning sensation in the chest or throat called heartburn or acid indigestion. Occasional reflux is common. Persistent reflux that occurs more than twice a week is considered GERD, and it can eventually lead to more serious health problems. People of all ages can have GERD. Studies have shown that GERD may worsen or contribute to asthma, chronic cough, and pulmonary fibrosis.   What are the symptoms of GERD? The main symptom of GERD in adults is frequent heartburn, also called acid indigestion--burning-type pain in the lower part of the mid-chest, behind the breast bone, and in the mid-abdomen.  Not all reflux is acidic in nature, and many patients don't have heart burn at all. Sometimes it feels like a cough (either dry or with mucus), choking sensation, asthma, shortness of breath, waking up at night, frequent throat clearing, or trouble swallowing.    What causes GERD? The reason some people develop GERD is still unclear. However, research shows that in people with GERD, the LES relaxes while the rest of the esophagus is working. Anatomical abnormalities such as a hiatal hernia may also contribute to GERD. A hiatal hernia occurs when the upper part of the stomach and the  LES move above the diaphragm, the muscle wall that separates the stomach from the chest. Normally, the diaphragm helps the LES keep acid from rising up into the esophagus. When a hiatal hernia is present, acid reflux can occur more easily. A hiatal hernia can occur in people of any age and is most often a normal finding in otherwise healthy people over age 74. Most of the time, a hiatal hernia produces no symptoms.   Other factors that may contribute to GERD include - Obesity or recent  weight gain - Pregnancy  - Smoking  - Diet - Certain medications  Common foods that can worsen reflux symptoms include: - carbonated beverages - artificial sweeteners - citrus fruits  - chocolate  - drinks with caffeine or alcohol  - fatty and fried foods  - garlic and onions  - mint flavorings  - spicy foods  - tomato-based foods, like spaghetti sauce, salsa, chili, and pizza   Lifestyle Changes If you smoke, stop.  Avoid foods and beverages that worsen symptoms (see above.) Lose weight if needed.  Eat small, frequent meals.  Wear loose-fitting clothes.  Avoid lying down for 3 hours after a meal.  Raise the head of your bed 6 to 8 inches by securing wood blocks under the bedposts. Just using extra pillows will not help, but using a wedge-shaped pillow may be helpful.  Medications  H2 blockers, such as cimetidine (Tagamet HB), famotidine (Pepcid AC), nizatidine (Axid AR), and ranitidine (Zantac 75), decrease acid production. They are available in prescription strength and over-the-counter strength. These drugs provide short-term relief and are effective for about half of those who have GERD symptoms.  Proton pump inhibitors include omeprazole (Prilosec, Zegerid), lansoprazole (Prevacid), pantoprazole  (Protonix ), rabeprazole (Aciphex), and esomeprazole (Nexium), which are available by prescription. Prilosec is also available in over-the-counter strength. Proton pump inhibitors are more effective than H2 blockers and can relieve symptoms and heal the esophageal lining in almost everyone who has GERD.  Because drugs work in different ways, combinations of medications may help control symptoms. People who get heartburn after eating may take both antacids and H2 blockers. The antacids work first to neutralize the acid in the stomach, and then the H2 blockers act on acid production. By the time the antacid stops working, the H2 blocker will have stopped acid production. Your health care  provider is the best source of information about how to use medications for GERD.   Points to Remember 1. You can have GERD without having heartburn. Your symptoms could include a dry cough, asthma symptoms, or trouble swallowing.  2. Taking medications daily as prescribed is important in controlling you symptoms.  Sometimes it can take up to 8 weeks to fully achieve the effects of the medications prescribed.  3. Coughing related to GERD can be difficult to treat and is very frustrating!  However, it is important to stick with these medications and lifestyle modifications before pursuing more aggressive or invasive test and treatments.

## 2024-04-01 ENCOUNTER — Encounter: Admitting: Family Medicine

## 2024-05-17 ENCOUNTER — Encounter: Admitting: Family Medicine

## 2024-07-08 ENCOUNTER — Other Ambulatory Visit: Payer: Self-pay | Admitting: Family Medicine

## 2024-07-09 ENCOUNTER — Other Ambulatory Visit: Payer: Self-pay

## 2024-07-09 ENCOUNTER — Other Ambulatory Visit (HOSPITAL_COMMUNITY): Payer: Self-pay

## 2024-07-09 MED ORDER — PANTOPRAZOLE SODIUM 40 MG PO TBEC
40.0000 mg | DELAYED_RELEASE_TABLET | Freq: Every day | ORAL | 0 refills | Status: DC
  Filled 2024-07-09: qty 90, 90d supply, fill #0

## 2024-07-15 ENCOUNTER — Ambulatory Visit: Admitting: Family Medicine

## 2024-07-15 ENCOUNTER — Encounter: Payer: Self-pay | Admitting: Family Medicine

## 2024-07-15 VITALS — BP 105/69 | HR 62 | Temp 98.2°F | Ht 69.0 in | Wt 201.2 lb

## 2024-07-15 DIAGNOSIS — Z131 Encounter for screening for diabetes mellitus: Secondary | ICD-10-CM | POA: Diagnosis not present

## 2024-07-15 DIAGNOSIS — Z1503 Genetic susceptibility to malignant neoplasm of prostate: Secondary | ICD-10-CM | POA: Diagnosis not present

## 2024-07-15 DIAGNOSIS — Z1509 Genetic susceptibility to other malignant neoplasm: Secondary | ICD-10-CM | POA: Diagnosis not present

## 2024-07-15 DIAGNOSIS — J4520 Mild intermittent asthma, uncomplicated: Secondary | ICD-10-CM | POA: Diagnosis not present

## 2024-07-15 DIAGNOSIS — K219 Gastro-esophageal reflux disease without esophagitis: Secondary | ICD-10-CM | POA: Diagnosis not present

## 2024-07-15 DIAGNOSIS — Z0000 Encounter for general adult medical examination without abnormal findings: Secondary | ICD-10-CM | POA: Diagnosis not present

## 2024-07-15 DIAGNOSIS — Z1159 Encounter for screening for other viral diseases: Secondary | ICD-10-CM | POA: Diagnosis not present

## 2024-07-15 DIAGNOSIS — Z1589 Genetic susceptibility to other disease: Secondary | ICD-10-CM

## 2024-07-15 DIAGNOSIS — Z23 Encounter for immunization: Secondary | ICD-10-CM | POA: Diagnosis not present

## 2024-07-15 DIAGNOSIS — Z13220 Encounter for screening for lipoid disorders: Secondary | ICD-10-CM

## 2024-07-15 DIAGNOSIS — Z8042 Family history of malignant neoplasm of prostate: Secondary | ICD-10-CM | POA: Diagnosis not present

## 2024-07-15 DIAGNOSIS — Z114 Encounter for screening for human immunodeficiency virus [HIV]: Secondary | ICD-10-CM | POA: Diagnosis not present

## 2024-07-15 DIAGNOSIS — F419 Anxiety disorder, unspecified: Secondary | ICD-10-CM | POA: Diagnosis not present

## 2024-07-15 DIAGNOSIS — Z125 Encounter for screening for malignant neoplasm of prostate: Secondary | ICD-10-CM

## 2024-07-15 DIAGNOSIS — Z1501 Genetic susceptibility to malignant neoplasm of breast: Secondary | ICD-10-CM | POA: Diagnosis not present

## 2024-07-15 DIAGNOSIS — Z0001 Encounter for general adult medical examination with abnormal findings: Secondary | ICD-10-CM

## 2024-07-15 LAB — COMPREHENSIVE METABOLIC PANEL WITH GFR
ALT: 13 U/L (ref 0–53)
AST: 44 U/L — ABNORMAL HIGH (ref 0–37)
Albumin: 4.5 g/dL (ref 3.5–5.2)
Alkaline Phosphatase: 60 U/L (ref 39–117)
BUN: 11 mg/dL (ref 6–23)
CO2: 29 meq/L (ref 19–32)
Calcium: 9.6 mg/dL (ref 8.4–10.5)
Chloride: 104 meq/L (ref 96–112)
Creatinine, Ser: 0.95 mg/dL (ref 0.40–1.50)
GFR: 100.92 mL/min (ref >60.00)
Glucose, Bld: 99 mg/dL (ref 70–99)
Potassium: 4.1 meq/L (ref 3.5–5.1)
Sodium: 139 meq/L (ref 135–145)
Total Bilirubin: 0.6 mg/dL (ref 0.2–1.2)
Total Protein: 7 g/dL (ref 6.0–8.3)

## 2024-07-15 LAB — LIPID PANEL
Cholesterol: 172 mg/dL (ref 0–200)
HDL: 59.4 mg/dL (ref >39.00)
LDL Cholesterol: 94 mg/dL (ref 0–99)
NonHDL: 112.55
Total CHOL/HDL Ratio: 3
Triglycerides: 94 mg/dL (ref 0.0–149.0)
VLDL: 18.8 mg/dL (ref 0.0–40.0)

## 2024-07-15 LAB — CBC
HCT: 43.1 % (ref 39.0–52.0)
Hemoglobin: 14.8 g/dL (ref 13.0–17.0)
MCHC: 34.4 g/dL (ref 30.0–36.0)
MCV: 89.4 fl (ref 78.0–100.0)
Platelets: 183 K/uL (ref 150.0–400.0)
RBC: 4.82 Mil/uL (ref 4.22–5.81)
RDW: 13.5 % (ref 11.5–15.5)
WBC: 5.6 K/uL (ref 4.0–10.5)

## 2024-07-15 LAB — HEMOGLOBIN A1C: Hgb A1c MFr Bld: 5.3 % (ref 4.6–6.5)

## 2024-07-15 LAB — PSA: PSA: 0.58 ng/mL (ref 0.10–4.00)

## 2024-07-15 LAB — TSH: TSH: 1.49 u[IU]/mL (ref 0.35–5.50)

## 2024-07-15 NOTE — Progress Notes (Signed)
 Chief Complaint:  Charles Gordon is a 38 y.o. male who presents today for his annual comprehensive physical exam.    Assessment/Plan:   Chronic Problems Addressed Today: GERD (gastroesophageal reflux disease) Stable on Protonix  40 mg daily.  Tolerating well.  Does not need refill today.  Asthma, mild intermittent Symptoms are mild and intermittent.  Currently not having any symptoms.  Reassuring exam today.  Uses Flovent  as needed seasonally.  Does not need refill today.  Will update pneumonia vaccine.  Anxiety He discontinued Zoloft  several months ago.  Doing very well and mood is very well-controlled without meds.  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  Check PSA and refer for colonoscopy today.  Preventative Healthcare: Check labs.  Prevnar 20 given today.  Gets flu vaccine through work.  Will refer for colonoscopy as above.  Patient Counseling(The following topics were reviewed and/or handout was given):  -Nutrition: Stressed importance of moderation in sodium/caffeine intake, saturated fat and cholesterol, caloric balance, sufficient intake of fresh fruits, vegetables, and fiber.  -Stressed the importance of regular exercise.   -Substance Abuse: Discussed cessation/primary prevention of tobacco, alcohol, or other drug use; driving or other dangerous activities under the influence; availability of treatment for abuse.   -Injury prevention: Discussed safety belts, safety helmets, smoke detector, smoking near bedding or upholstery.   -Sexuality: Discussed sexually transmitted diseases, partner selection, use of condoms, avoidance of unintended pregnancy and contraceptive alternatives.   -Dental health: Discussed importance of regular tooth brushing, flossing, and dental visits.  -Health maintenance and immunizations reviewed. Please refer to Health maintenance section.  Return to care in 1 year for next  preventative visit.     Subjective:  HPI:  He has no acute complaints today. Patient is here today for his  annual physical.  See assessment / plan for status of chronic conditions.  Discussed the use of AI scribe software for clinical note transcription with the patient, who gave verbal consent to proceed.  History of Present Illness Charles Gordon is a 38 year old male who presents for an annual physical exam.  He is approaching 38 years of age and has a family history of breast cancer; he reports that it was recommended he have colonoscopies every five years starting at age 18. He previously underwent a colonoscopy in [place] due to weight loss and rectal bleeding, which was attributed to hemorrhoids. An EGD was performed for a stricture, and he is currently on Protonix  for management.  He has a history of asthma, diagnosed after experiencing wheezing for months following colds. He uses a steroid inhaler as needed when wheezing occurs. Currently, he is not experiencing wheezing but anticipates it may recur with future colds.  He discontinued Zoloft , which he associated with weight gain, and reports feeling okay and losing weight since stopping it. He previously experienced panic attacks but is not currently experiencing them.  He is actively managing his weight through diet and exercise, including biking to work and rock climbing with his daughter. He reports a weight loss of 10 pounds since June.    Lifestyle Diet: Balanced. Plenty of fruits and vegetables.  Exercise: Very active.  Rides bike daily and frequently rock climbs.     07/15/2024    9:21 AM  Depression screen PHQ 2/9  Decreased Interest 0  Down, Depressed, Hopeless 0  PHQ - 2 Score 0    Health Maintenance Due  Topic Date Due  HIV Screening  Never done   Hepatitis C Screening  Never done   Pneumococcal Vaccine (1 of 2 - PCV) Never done   Colonoscopy  09/21/2023   Influenza Vaccine  05/17/2024      ROS: Per HPI, otherwise a complete review of systems was negative.   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 12 g 12   ketoconazole  (NIZORAL ) 2 % shampoo Apply topically 2 times a week. 120 mL 0   pantoprazole  (PROTONIX ) 40 MG tablet Take 1 tablet (40 mg total) by mouth daily. 90 tablet 0   No current facility-administered medications for this visit.    Allergies-reviewed and updated Allergies  Allergen Reactions   Sulfa Antibiotics Swelling   Penicillins Rash    Social History   Socioeconomic History   Marital status: Married    Spouse name: Not on file   Number of children: 3   Years of education: Not on file   Highest education level: Professional school degree (e.g., MD, DDS, DVM, JD)  Occupational History   Not on file  Tobacco Use   Smoking status: Never   Smokeless tobacco: Never  Vaping Use   Vaping status: Never Used  Substance and Sexual Activity   Alcohol use: Never   Drug use: Never   Sexual activity:  Not on file  Other Topics Concern   Not on file  Social History Narrative   Left Handed and uses right hand as well.    Lives in a one story home   Drinks caffeine    Social Drivers of Health   Financial Resource Strain: Low Risk  (07/12/2024)   Overall Financial Resource Strain (CARDIA)    Difficulty of Paying Living Expenses: Not hard at all  Food Insecurity: No Food Insecurity (07/12/2024)   Hunger Vital Sign    Worried About Running Out of Food in the Last Year: Never true    Ran Out of Food in the Last Year: Never true  Transportation Needs: No Transportation Needs (07/12/2024)   PRAPARE - Administrator, Civil Service (Medical): No    Lack of Transportation (Non-Medical): No  Physical Activity: Sufficiently Active (07/12/2024)   Exercise Vital Sign    Days of Exercise per Week: 5 days    Minutes of Exercise per Session: 30 min  Stress: No Stress Concern Present (07/12/2024)   [employer] of Occupational Health - Occupational Stress Questionnaire  Feeling of Stress: Only a little  Social Connections: Socially Integrated (07/12/2024)   Social Connection and Isolation Panel    Frequency of Communication with Friends and Family: More than three times a week    Frequency of Social Gatherings with Friends and Family: Once a week    Attends Religious Services: More than 4 times per year    Active Member of [employer] or Organizations: Yes    Attends Engineer, structural: More than 4 times per year    Marital Status: Married        Objective:  Physical Exam: BP 105/69   Pulse 62   Temp 98.2 F (36.8 C) (Temporal)   Ht 5' 9 (1.753 m)   Wt 201 lb 3.2 oz (91.3 kg)   SpO2 100%   BMI 29.71 kg/m   Body mass index is 29.71 kg/m. Wt Readings from Last 3 Encounters:  07/15/24 201 lb 3.2 oz (91.3 kg)  03/25/24 211 lb 9.6 oz (96 kg)  08/14/23 205 lb (93 kg)   Gen: NAD, resting comfortably HEENT: TMs normal bilaterally. OP clear. No thyromegaly noted.   CV: RRR with no murmurs appreciated Pulm: NWOB, CTAB with no crackles, wheezes, or rhonchi GI: Normal bowel sounds present. Soft, Nontender, Nondistended. MSK: no edema, cyanosis, or clubbing noted Skin: warm, dry Neuro: CN2-12 grossly intact. Strength 5/5 in upper and lower extremities. Reflexes symmetric and intact bilaterally.  Psych: Normal affect and thought content     Tala Eber M. Kennyth, MD 07/15/2024 10:01 AM

## 2024-07-15 NOTE — Assessment & Plan Note (Signed)
 Stable on Protonix  40 mg daily.  Tolerating well.  Does not need refill today.

## 2024-07-15 NOTE — Addendum Note (Signed)
 Addended by: IDA ELORA HERO on: 07/15/2024 10:10 AM   Modules accepted: Orders

## 2024-07-15 NOTE — Patient Instructions (Signed)
 It was very nice to see you today!  VISIT SUMMARY: Today, you had your annual physical exam. We discussed your upcoming 38th birthday and your family history of breast cancer, and we planned some important screenings. We also reviewed your asthma, weight management, and anxiety.  YOUR PLAN: ADULT WELLNESS VISIT: Routine wellness visit focused on screenings for your 38th birthday and family history of breast cancer with CHEK2 mutation. -We will refer you for a colonoscopy, and order PSA screening, lipid panel, A1c, CBC, metabolic panel, HIV, and Hepatitis C screening. -You received a pneumonia vaccine today.  ASTHMA: Asthma symptoms persist with intermittent wheezing episodes requiring albuterol  inhaler and recent use of steroid inhaler. -We have added asthma to your problem list. -You received a pneumonia vaccine today.  OBESITY: Weight reduced by 10 pounds since June through physical activity and portion control. -Continue regular physical activity and portion control.  ANXIETY DISORDER: Anxiety is manageable without medication, no recent panic attacks. -Continue to monitor your anxiety and let us  know if you need any support.  Return in about 1 year (around 07/15/2025) for Annual Physical.   Take care, Dr Kennyth  PLEASE NOTE:  If you had any lab tests, please let us  know if you have not heard back within a few days. You may see your results on mychart before we have a chance to review them but we will give you a call once they are reviewed by us .   If we ordered any referrals today, please let us  know if you have not heard from their office within the next week.   If you had any urgent prescriptions sent in today, please check with the pharmacy within an hour of our visit to make sure the prescription was transmitted appropriately.   Please try these tips to maintain a healthy lifestyle:  Eat at least 3 REAL meals and 1-2 snacks per day.  Aim for no more than 5 hours between  eating.  If you eat breakfast, please do so within one hour of getting up.   Each meal should contain half fruits/vegetables, one quarter protein, and one quarter carbs (no bigger than a computer mouse)  Cut down on sweet beverages. This includes juice, soda, and sweet tea.   Drink at least 1 glass of water with each meal and aim for at least 8 glasses per day  Exercise at least 150 minutes every week.    Preventive Care 39-45 Years Old, Male Preventive care refers to lifestyle choices and visits with your health care provider that can promote health and wellness. Preventive care visits are also called wellness exams. What can I expect for my preventive care visit? Counseling During your preventive care visit, your health care provider may ask about your: Medical history, including: Past medical problems. Family medical history. Current health, including: Emotional well-being. Home life and relationship well-being. Sexual activity. Lifestyle, including: Alcohol, nicotine or tobacco, and drug use. Access to firearms. Diet, exercise, and sleep habits. Safety issues such as seatbelt and bike helmet use. Sunscreen use. Work and work Astronomer. Physical exam Your health care provider may check your: Height and weight. These may be used to calculate your BMI (body mass index). BMI is a measurement that tells if you are at a healthy weight. Waist circumference. This measures the distance around your waistline. This measurement also tells if you are at a healthy weight and may help predict your risk of certain diseases, such as type 2 diabetes and high blood pressure. Heart  rate and blood pressure. Body temperature. Skin for abnormal spots. What immunizations do I need?  Vaccines are usually given at various ages, according to a schedule. Your health care provider will recommend vaccines for you based on your age, medical history, and lifestyle or other factors, such as travel or  where you work. What tests do I need? Screening Your health care provider may recommend screening tests for certain conditions. This may include: Lipid and cholesterol levels. Diabetes screening. This is done by checking your blood sugar (glucose) after you have not eaten for a while (fasting). Hepatitis B test. Hepatitis C test. HIV (human immunodeficiency virus) test. STI (sexually transmitted infection) testing, if you are at risk. Talk with your health care provider about your test results, treatment options, and if necessary, the need for more tests. Follow these instructions at home: Eating and drinking  Eat a healthy diet that includes fresh fruits and vegetables, whole grains, lean protein, and low-fat dairy products. Drink enough fluid to keep your urine pale yellow. Take vitamin and mineral supplements as recommended by your health care provider. Do not drink alcohol if your health care provider tells you not to drink. If you drink alcohol: Limit how much you have to 0-2 drinks a day. Know how much alcohol is in your drink. In the [place] one drink equals one 12 oz bottle of beer (355 mL), one 5 oz glass of wine (148 mL), or one 1 oz glass of hard liquor (44 mL). Lifestyle Brush your teeth every morning and night with fluoride toothpaste. Floss one time each day. Exercise for at least 30 minutes 5 or more days each week. Do not use any products that contain nicotine or tobacco. These products include cigarettes, chewing tobacco, and vaping devices, such as e-cigarettes. If you need help quitting, ask your health care provider. Do not use drugs. If you are sexually active, practice safe sex. Use a condom or other form of protection to prevent STIs. Find healthy ways to manage stress, such as: Meditation, yoga, or listening to music. Journaling. Talking to a trusted person. Spending time with friends and family. Minimize exposure to UV radiation to reduce your risk of skin  cancer. Safety Always wear your seat belt while driving or riding in a vehicle. Do not drive: If you have been drinking alcohol. Do not ride with someone who has been drinking. If you have been using any mind-altering substances or drugs. While texting. When you are tired or distracted. Wear a helmet and other protective equipment during sports activities. If you have firearms in your house, make sure you follow all gun safety procedures. Seek help if you have been physically or sexually abused. What's next? Go to your health care provider once a year for an annual wellness visit. Ask your health care provider how often you should have your eyes and teeth checked. Stay up to date on all vaccines. This information is not intended to replace advice given to you by your health care provider. Make sure you discuss any questions you have with your health care provider. Document Revised: 03/31/2021 Document Reviewed: 03/31/2021 Elsevier Patient Education  2024 [employer].

## 2024-07-15 NOTE — Assessment & Plan Note (Signed)
 Symptoms are mild and intermittent.  Currently not having any symptoms.  Reassuring exam today.  Uses Flovent  as needed seasonally.  Does not need refill today.  Will update pneumonia vaccine.

## 2024-07-15 NOTE — Assessment & Plan Note (Signed)
 Needs early screening for prostate and colon cancer.  Check PSA and refer for colonoscopy today.

## 2024-07-15 NOTE — Assessment & Plan Note (Signed)
 He discontinued Zoloft  several months ago.  Doing very well and mood is very well-controlled without meds.

## 2024-07-16 ENCOUNTER — Ambulatory Visit: Payer: Self-pay | Admitting: Family Medicine

## 2024-07-16 LAB — HEPATITIS C ANTIBODY: Hepatitis C Ab: NONREACTIVE

## 2024-07-16 LAB — HIV ANTIBODY (ROUTINE TESTING W REFLEX)
HIV 1&2 Ab, 4th Generation: NONREACTIVE
HIV FINAL INTERPRETATION: NEGATIVE

## 2024-07-16 NOTE — Progress Notes (Signed)
 AST is up a little bit.  We can recheck again at his next office visit or he can schedule lab appointment to come in sooner if he wishes..  The rest of his labs are all at goal.  He should keep up the great work with diet and exercise and we can recheck everything else in a year.

## 2024-07-18 NOTE — Telephone Encounter (Signed)
 See note

## 2024-07-19 NOTE — Telephone Encounter (Signed)
 Noted.  Agree with us  checking next office visit.

## 2024-10-03 ENCOUNTER — Other Ambulatory Visit (HOSPITAL_COMMUNITY): Payer: Self-pay

## 2024-10-03 ENCOUNTER — Other Ambulatory Visit: Payer: Self-pay | Admitting: Family Medicine

## 2024-10-03 MED ORDER — PANTOPRAZOLE SODIUM 40 MG PO TBEC
40.0000 mg | DELAYED_RELEASE_TABLET | Freq: Every day | ORAL | 0 refills | Status: AC
  Filled 2024-10-03: qty 90, 90d supply, fill #0

## 2024-10-22 ENCOUNTER — Encounter: Payer: Self-pay | Admitting: Family Medicine

## 2024-10-23 NOTE — Telephone Encounter (Signed)
 Spoke with patient, appt schedule on 10/28/2024 at 07:40am

## 2024-10-28 ENCOUNTER — Other Ambulatory Visit: Payer: Self-pay

## 2024-10-28 ENCOUNTER — Encounter: Payer: Self-pay | Admitting: Family Medicine

## 2024-10-28 ENCOUNTER — Other Ambulatory Visit (HOSPITAL_BASED_OUTPATIENT_CLINIC_OR_DEPARTMENT_OTHER): Payer: Self-pay

## 2024-10-28 ENCOUNTER — Ambulatory Visit (INDEPENDENT_AMBULATORY_CARE_PROVIDER_SITE_OTHER): Admitting: Family Medicine

## 2024-10-28 VITALS — BP 110/70 | HR 74 | Temp 97.9°F | Ht 69.0 in | Wt 193.2 lb

## 2024-10-28 DIAGNOSIS — F419 Anxiety disorder, unspecified: Secondary | ICD-10-CM

## 2024-10-28 DIAGNOSIS — B360 Pityriasis versicolor: Secondary | ICD-10-CM | POA: Diagnosis not present

## 2024-10-28 MED ORDER — KETOCONAZOLE 2 % EX SHAM
1.0000 | MEDICATED_SHAMPOO | CUTANEOUS | 0 refills | Status: AC
  Filled 2024-10-28: qty 120, 30d supply, fill #0

## 2024-10-28 MED ORDER — SERTRALINE HCL 100 MG PO TABS
50.0000 mg | ORAL_TABLET | Freq: Every day | ORAL | 3 refills | Status: AC
  Filled 2024-10-28 (×2): qty 90, 90d supply, fill #0

## 2024-10-28 MED ORDER — FLUCONAZOLE 150 MG PO TABS
150.0000 mg | ORAL_TABLET | ORAL | 0 refills | Status: AC | PRN
  Filled 2024-10-28: qty 2, 6d supply, fill #0

## 2024-10-28 NOTE — Patient Instructions (Signed)
 It was very nice to see you today!  VISIT SUMMARY: During your visit, we discussed your worsening anxiety symptoms after discontinuing Zoloft  and your recurrent tinea versicolor condition. We have made a plan to address both issues.  YOUR PLAN: GENERALIZED ANXIETY DISORDER AND DEPRESSION: Your anxiety symptoms have worsened after stopping Zoloft , which had previously helped you manage your condition. -Restart Zoloft  at 50 mg daily for 2 weeks. If necessary, the dose may be increased to 100 mg. -A prescription has been sent to your pharmacy. -Please send a progress message in two weeks to update us  on how you are feeling.  TINEA VERSICOLOR: Your tinea versicolor has flared up, likely due to sun exposure and inconsistent use of antifungal treatment. -Use ketoconazole  shampoo twice a week to help manage the condition. -Take Diflucan  as prescribed for acute management.  Return if symptoms worsen or fail to improve.   Take care, Dr Kennyth  PLEASE NOTE:  If you had any lab tests, please let us  know if you have not heard back within a few days. You may see your results on mychart before we have a chance to review them but we will give you a call once they are reviewed by us .   If we ordered any referrals today, please let us  know if you have not heard from their office within the next week.   If you had any urgent prescriptions sent in today, please check with the pharmacy within an hour of our visit to make sure the prescription was transmitted appropriately.   Please try these tips to maintain a healthy lifestyle:  Eat at least 3 REAL meals and 1-2 snacks per day.  Aim for no more than 5 hours between eating.  If you eat breakfast, please do so within one hour of getting up.   Each meal should contain half fruits/vegetables, one quarter protein, and one quarter carbs (no bigger than a computer mouse)  Cut down on sweet beverages. This includes juice, soda, and sweet tea.   Drink at  least 1 glass of water with each meal and aim for at least 8 glasses per day  Exercise at least 150 minutes every week.

## 2024-10-28 NOTE — Assessment & Plan Note (Signed)
 Patient with mild flare.  Will refill his ketoconazole  and give one-time dose of Diflucan .  He has not felt this combination previously.

## 2024-10-28 NOTE — Assessment & Plan Note (Signed)
 He has been off of Zoloft  for several months initially did well but has had worsening anxiety symptoms recently.  We discussed treatment options.  Will restart Zoloft .  Go to 50 mg daily for a couple weeks and then increase back to 100 mg daily if needed.  He will follow-up with me in a few weeks via MyChart.

## 2024-10-28 NOTE — Progress Notes (Signed)
" ° °  Charles Gordon is a 39 y.o. male who presents today for an office visit.  Assessment/Plan:  New/Acute Problems: Angioma Patient with angioma in his back.  He wishes for removal though will come back to have this done.  Chronic Problems Addressed Today: Anxiety He has been off of Zoloft  for several months initially did well but has had worsening anxiety symptoms recently.  We discussed treatment options.  Will restart Zoloft .  Go to 50 mg daily for a couple weeks and then increase back to 100 mg daily if needed.  He will follow-up with me in a few weeks via MyChart.  Tinea versicolor Patient with mild flare.  Will refill his ketoconazole  and give one-time dose of Diflucan .  He has not felt this combination previously.     Subjective:  HPI:  See assessment / plan for status of chronic conditions.      Discussed the use of AI scribe software for clinical note transcription with the patient, who gave verbal consent to proceed.  History of Present Illness Charles Gordon is a 39 year old male who presents with worsening anxiety symptoms after discontinuing Zoloft .  He has a history of anxiety and was stable on Zoloft  for four to five years. After discontinuing the medication, he initially felt fine, but over the past two to three months, he has experienced a return of anxiety symptoms. These include excessive worrying, particularly about his marriage, despite no apparent issues.  There is a family history of depression, with his sister having been hospitalized multiple times and his mother also prone to depression.  He has been actively working on weight loss through diet and exercise, including rock climbing twice a week with his daughter and riding his bike to work. He has lost approximately 20 pounds since the summer, reducing his weight from 205-210 pounds to around 185 pounds. He attributes this success to increased physical activity and dietary changes.  He reports  issues with tinea versicolor, which tends to flare up in the winter and was exacerbated by sun exposure during a recent trip to [place] . He has not been consistent with using ketoconazole  shampoo twice a week, which could help manage the condition.         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  Neuro: Grossly normal, moves all extremities Psych: Normal affect and thought content      Charles Moret M. Kennyth, MD 10/28/2024 8:18 AM  "

## 2024-11-18 ENCOUNTER — Ambulatory Visit: Admitting: Gastroenterology

## 2024-12-13 ENCOUNTER — Ambulatory Visit: Admitting: Gastroenterology

## 2025-08-08 ENCOUNTER — Encounter: Admitting: Family Medicine
